# Patient Record
Sex: Female | Born: 1990
Health system: Southern US, Community
[De-identification: ages and names within clinical notes are randomized; demographics above are authoritative.]

## PROBLEM LIST (undated history)

## (undated) ENCOUNTER — Emergency Department (HOSPITAL_BASED_OUTPATIENT_CLINIC_OR_DEPARTMENT_OTHER): Discharge status: Absent without leave | Payer: Medicaid Other

## (undated) DIAGNOSIS — N83209 Unspecified ovarian cyst, unspecified side: Secondary | ICD-10-CM

## (undated) DIAGNOSIS — G43909 Migraine, unspecified, not intractable, without status migrainosus: Secondary | ICD-10-CM

## (undated) DIAGNOSIS — I10 Essential (primary) hypertension: Secondary | ICD-10-CM

## (undated) DIAGNOSIS — N200 Calculus of kidney: Secondary | ICD-10-CM

## (undated) DIAGNOSIS — E119 Type 2 diabetes mellitus without complications: Secondary | ICD-10-CM

## (undated) HISTORY — PX: OTHER SURGICAL HISTORY: SHX169

## (undated) HISTORY — PX: CHOLECYSTECTOMY: SHX55

## (undated) HISTORY — DX: Calculus of kidney: N20.0

## (undated) HISTORY — DX: Essential (primary) hypertension: I10

---

## 2010-12-19 ENCOUNTER — Emergency Department (HOSPITAL_BASED_OUTPATIENT_CLINIC_OR_DEPARTMENT_OTHER)
Admission: EM | Admit: 2010-12-19 | Discharge: 2010-12-19 | Disposition: A | Discharge status: Home / Self Care | Payer: Medicaid Other | Attending: Emergency Medicine | Admitting: Emergency Medicine

## 2010-12-19 ENCOUNTER — Emergency Department (INDEPENDENT_AMBULATORY_CARE_PROVIDER_SITE_OTHER): Payer: Medicaid Other

## 2010-12-19 DIAGNOSIS — M549 Dorsalgia, unspecified: Secondary | ICD-10-CM

## 2010-12-19 DIAGNOSIS — N390 Urinary tract infection, site not specified: Secondary | ICD-10-CM | POA: Insufficient documentation

## 2010-12-19 LAB — URINALYSIS, ROUTINE W REFLEX MICROSCOPIC
Bilirubin Urine: NEGATIVE
Ketones, ur: NEGATIVE mg/dL
Nitrite: NEGATIVE
Protein, ur: NEGATIVE mg/dL
Urobilinogen, UA: 0.2 mg/dL (ref 0.0–1.0)
pH: 5.5 (ref 5.0–8.0)

## 2010-12-19 LAB — URINE MICROSCOPIC-ADD ON

## 2010-12-19 LAB — PREGNANCY, URINE: Preg Test, Ur: NEGATIVE

## 2010-12-20 LAB — URINE CULTURE
Colony Count: 75000
Culture  Setup Time: 201203102115

## 2010-12-24 ENCOUNTER — Emergency Department (INDEPENDENT_AMBULATORY_CARE_PROVIDER_SITE_OTHER): Payer: Medicaid Other

## 2010-12-24 ENCOUNTER — Emergency Department (HOSPITAL_BASED_OUTPATIENT_CLINIC_OR_DEPARTMENT_OTHER)
Admission: EM | Admit: 2010-12-24 | Discharge: 2010-12-24 | Disposition: A | Discharge status: Other Acute Inpatient Hospital | Payer: Medicaid Other | Attending: Emergency Medicine | Admitting: Emergency Medicine

## 2010-12-24 ENCOUNTER — Inpatient Hospital Stay (HOSPITAL_COMMUNITY)
Admission: AD | Admit: 2010-12-24 | Discharge: 2010-12-25 | DRG: 419 | Disposition: A | Discharge status: Home / Self Care | Payer: Medicaid Other | Source: Other Acute Inpatient Hospital | Attending: Surgery | Admitting: Surgery

## 2010-12-24 ENCOUNTER — Inpatient Hospital Stay (HOSPITAL_COMMUNITY): Payer: Medicaid Other

## 2010-12-24 ENCOUNTER — Other Ambulatory Visit: Payer: Self-pay | Admitting: Surgery

## 2010-12-24 DIAGNOSIS — R1013 Epigastric pain: Secondary | ICD-10-CM | POA: Insufficient documentation

## 2010-12-24 DIAGNOSIS — R112 Nausea with vomiting, unspecified: Secondary | ICD-10-CM

## 2010-12-24 DIAGNOSIS — Z79899 Other long term (current) drug therapy: Secondary | ICD-10-CM | POA: Insufficient documentation

## 2010-12-24 DIAGNOSIS — R109 Unspecified abdominal pain: Secondary | ICD-10-CM

## 2010-12-24 DIAGNOSIS — E8881 Metabolic syndrome: Secondary | ICD-10-CM | POA: Diagnosis present

## 2010-12-24 DIAGNOSIS — I10 Essential (primary) hypertension: Secondary | ICD-10-CM | POA: Diagnosis present

## 2010-12-24 DIAGNOSIS — E119 Type 2 diabetes mellitus without complications: Secondary | ICD-10-CM | POA: Insufficient documentation

## 2010-12-24 DIAGNOSIS — E669 Obesity, unspecified: Secondary | ICD-10-CM | POA: Diagnosis present

## 2010-12-24 DIAGNOSIS — R1011 Right upper quadrant pain: Secondary | ICD-10-CM

## 2010-12-24 DIAGNOSIS — K8000 Calculus of gallbladder with acute cholecystitis without obstruction: Principal | ICD-10-CM | POA: Diagnosis present

## 2010-12-24 LAB — COMPREHENSIVE METABOLIC PANEL
ALT: 45 U/L — ABNORMAL HIGH (ref 0–35)
Albumin: 4.4 g/dL (ref 3.5–5.2)
Alkaline Phosphatase: 68 U/L (ref 39–117)
Chloride: 105 mEq/L (ref 96–112)
Glucose, Bld: 125 mg/dL — ABNORMAL HIGH (ref 70–99)
Potassium: 4.2 mEq/L (ref 3.5–5.1)
Sodium: 146 mEq/L — ABNORMAL HIGH (ref 135–145)
Total Bilirubin: 0.5 mg/dL (ref 0.3–1.2)
Total Protein: 8.3 g/dL (ref 6.0–8.3)

## 2010-12-24 LAB — URINALYSIS, ROUTINE W REFLEX MICROSCOPIC
Bilirubin Urine: NEGATIVE
Ketones, ur: 15 mg/dL — AB
Nitrite: POSITIVE — AB
Protein, ur: NEGATIVE mg/dL
Specific Gravity, Urine: 1.027 (ref 1.005–1.030)
Urobilinogen, UA: 1 mg/dL (ref 0.0–1.0)

## 2010-12-24 LAB — PREGNANCY, URINE: Preg Test, Ur: NEGATIVE

## 2010-12-24 LAB — DIFFERENTIAL
Basophils Absolute: 0 10*3/uL (ref 0.0–0.1)
Eosinophils Relative: 2 % (ref 0–5)
Lymphocytes Relative: 21 % (ref 12–46)
Lymphs Abs: 2.8 10*3/uL (ref 0.7–4.0)
Neutrophils Relative %: 70 % (ref 43–77)

## 2010-12-24 LAB — CBC
HCT: 38.6 % (ref 36.0–46.0)
Platelets: 265 10*3/uL (ref 150–400)
RBC: 4.74 MIL/uL (ref 3.87–5.11)
RDW: 14.8 % (ref 11.5–15.5)
WBC: 13.3 10*3/uL — ABNORMAL HIGH (ref 4.0–10.5)

## 2010-12-24 LAB — URINE MICROSCOPIC-ADD ON

## 2010-12-25 LAB — URINE CULTURE: Colony Count: 75000

## 2011-01-04 NOTE — H&P (Signed)
NAMEJUDITHANN, [name]         ACCOUNT NO.:  [id number]  MEDICAL RECORD NO.:  [id number]           PATIENT TYPE:  I  LOCATION:  5158                         FACILITY:  [facility]  PHYSICIAN:  Sandria Bales. Ezzard Standing, M.D.  DATE OF BIRTH:  Jul 26, 1991  DATE OF ADMISSION:  12/24/2010                             HISTORY & PHYSICAL   REFERRING PHYSICIAN:  Dr. Ethelda Chick  CHIEF COMPLAINT:  Abdominal pain, nausea, and vomiting.  BRIEF HISTORY:  The patient is a 20 year old white female who reports waking at around 2 a.m. with nausea, vomiting, and abdominal pain.  She was seen on December 19, 2010, with back pain and underwent an LS spine, which was negative and was treated for a urinary tract infection.  She reports ongoing nausea and vomiting on Sunday.  Monday, she was having ongoing discomfort with nausea and generally felt bad all day.  Tuesday, she was actually better.  No nausea or vomiting, ate well.  On Wednesday, she did well.  On Thursday morning at around 2 a.m., she woke up with most recent bout of nausea and vomiting.    She returned to the ER at [facility].  Acute abdominal films showed no evidence of abdominal disease, normal gas pattern.  Abdominal ultrasound shows small stones are noted.  There is no pericholecystic fluid seen.  Gallbladder was not thickened.  However, there is pain over the gallbladder with compression and it was considered probable for cholecystitis.  Common bile duct is partially obscured, both measures 2 mm on the best possible measurements.  The livers echogenic suggestive of fatty liver.  There is no ductal dilatation.  The IVC was obscured by bowel gas, the pancreas was also obscured.  The spleen was enlarged at 13.7 cm.  The left and right kidney showed no hydronephrosis, the right was 12.9 cm and left was 13.  The abdominal aorta was negative.  It was their impression that the patient had, 1. Multiple gallstones with acute cholecystitis as a possible   diagnosis, no gallbladder wall edema was seen. 2. Fatty infiltration of liver. 3. Splenomegaly.  LABORATORY DATA:  Labs showed an elevated white count of 13.3, hematocrit of 38.6, hemoglobin 12.9, and platelets 265,000.  CMP showed a sodium of 146, potassium of 4.2, chloride of 105, CO2 of 25, BUN of 15, and creatinine is 0.7.  Total bilirubin 0.5, alk phos 68, SGOT elevated at 51, SGPT was elevated at 45, total protein is 8.3, and albumin is 4.4.    UA shows large amount of blood and the urine is cloudy. Leukocytes are positive.  Urine microscopic shows 3-6 white cells per high-power field, red cells were TNTC, there was few bacteria and mucus. Urine pregnancy was negative.  Lipase was 110.    The patient was subsequently referred to Dr. Ezzard Standing and transferred to [facility] for elective cholecystectomy and further treatment is indicated.  PAST MEDICAL HISTORY: 1. High blood pressure since age 20. 2. Borderline diabetes. 3. History of ADD, she had been offered meds 1 year. 4. Height is 68 inches.  Weight is 274 pounds.  BMI is 41.7.  PAST SURGICAL HISTORY:  She had a  surgical fallopian tube removed 14 years ago.  FAMILY HISTORY:  Mother is living with high blood pressure, elevated cholesterol, and diabetes.  Father is unknown.  One brother in good health.  No sisters.  SOCIAL HISTORY:  She denies tobacco, alcohol, or drugs.  She is unemployed and lives with her mother.  REVIEW OF SYSTEMS:   FEVER:  None.   SKIN:  No changes.   CEREBROVASCULAR: No changes.   PULMONARY:  No changes.   CARDIAC:  No cardiac history of chest pain.   GASTROINTESTINAL: Positive for nausea, vomiting, abdominal pain, and back pain since Sunday.  No diarrhea, constipation, or blood in the stool.   GENITOURINARY:  Negative currently.   LOWER EXTREMITIES: No edema or arthritis.   MUSCULOSKELETAL/ARTHRITIS:  None.   ENDOCRINE: None currently.   WEIGHT:  She has lost about 5 pounds over  the last several months secondary to exercise.  PSYCHIATRIC:  No new changes.  CURRENT MEDICATIONS: 1. Atenolol 50 mg daily. 2. Femcon FE birth control one daily. 3. Macrobid and Pyridium.  She is not sure whether just one of those     left.  ALLERGIES:  None.  PHYSICAL EXAMINATION:  GENERAL:  This is a obese white female in no acute distress, very anxious, and in pain. VITAL SIGNS:  Temperature in [place] was afebrile, 98.4 and 98.6, blood pressures range from 145/94 on admission then up to 163/93 to a low of 132/82, heart rates has been screened 70 and 86, and sats are 99- 100% on room air.  She has had ongoing pain. HEENT:  Head:  Normocephalic.  Eyes:  PERRLA.  Ears:  Hearing is normal. Nose, throat, and mouth:  Grossly normal.  She has a stud in her nose. NECK:  Trachea is in the midline.  Thyroid was not palpable. CHEST:  Clear to auscultation.  Chest wall is nontender. RESPIRATORY:  Effort is normal. CARDIAC:  Normal S1, S2.  No murmur was heard.  Pulses are +2 and equal in both upper and lower extremities. ABDOMEN:  Bowel sounds are present.  Abdomen is nondistended.  She is extremely tender over the right upper quadrant.  No hernias, masses, or abscesses were noted. GENITOURINARY/RECTAL:  Deferred. LYMPHADENOPATHY:  None palpated. MUSCULOSKELETAL:  Normal strength and joints. SKIN:  No changes. NEUROLOGIC:  Cranial nerves grossly within normal limits.  No focal changes. PSYCHIATRIC:  She is very anxious and crying.  LABORATORY AND DIAGNOSTICS:  As above.  IMPRESSION: 1. Cholelithiasis, abdominal pain, nausea, vomiting; probably acute     cholecystitis. 2. Hypertension. 3. Borderline insulin-resistant diabetes mellitus. 4. Attention deficit disorder off medications 1 year. 5. BMI of 41.5.  PLAN:  The patient has been ready for the OR now.  We will start her on some antibiotics, Phenergan, and morphine for pain.  Further workup and evaluation as  needed.   Eber Hong, P.A.   Sandria Bales. Ezzard Standing, M.D., FACS   WDJ/MEDQ  D:  12/24/2010  T:  12/24/2010  Job:  [id number]  cc:   Charlesetta Garibaldi, MD Earlene Plater, M.D.  Electronically Signed by Sherrie George P.A. on 12/30/2010 03:32:05 PM Electronically Signed by Ovidio Kin M.D. on 01/04/2011 10:38:34 AM

## 2011-01-04 NOTE — Op Note (Signed)
[name], [name]         ACCOUNT NO.:  [id number]  MEDICAL RECORD NO.:  [id number]           PATIENT TYPE:  I  LOCATION:  5158                         FACILITY:  [facility]  PHYSICIAN:  Sandria Bales. Ezzard Standing, M.D.  DATE OF BIRTH:  [birth year]  DATE OF PROCEDURE: 24 December 2010                              OPERATIVE REPORT   PREOPERATIVE DIAGNOSIS:  Cholecystitis with cholelithiasis.  POSTOPERATIVE DIAGNOSIS:  Acute edematous cholecystitis with white bile, cholelithiasis.  PROCEDURE:  Laparoscopic cholecystectomy (five port).  SURGEON:  Sandria Bales. Ezzard Standing, M.D.  FIRST ASSISTANT:  Juanetta Gosling, MD.  ANESTHESIA:  General endotracheal.  ESTIMATED BLOOD LOSS:  Minimal.  PROCEDURE:  Ms. [name] is a 20 year old white female who sees Dr. Earlene Plater at [facility], who is her primary care doctor.  She had a urinary tract infection for she was treated at the [facility] emergency room near [facility] about a week ago.  She then re- presented today with acute abdominal pain and ultrasound showed gallstones.  She had normal liver functions, mildly elevated white blood count of 13,300, and she was sent over from the [facility] ER to [facility].  She has the signs, symptoms, and renalogic exam consistent with acute cholecystitis.  I talked to the patient and her parents about proceeding with cholecystectomy, talked about both the indications and risks of gallbladder surgery.  The risks of gallbladder surgery include but are not limited to, bleeding, infection, common bile duct injury, the possibility of open surgery, and the possibility of another cause for her acute abdominal pain.  She is only 20 years old.  She is morbidly obese with a BMI of approximately 41, has already been treated for hypertension.  OPERATIVE NOTE:  She was placed in a supine position in room 17.  Dr. Sharee Holster I think is her attending anesthesiologist.  She underwent a general  endotracheal anesthetic.  Her abdomen was prepped with ChloraPrep, sterilely draped.  Received 1 g of Ancef at commencement of this procedure.  A time-out was held and surgical checklist run.  I made an infraumbilical incision with sharp dissection, carried down to the abdominal cavity.  A 0-degree 10-mm laparoscope was inserted through a 12-mm Hasson trocar and the Hasson trocar secured with a 0 Vicryl suture.  She had adhesions in her lower abdomen from a prior removal of left fallopian cyst several years ago, but I was able to get the scope into the abdominal cavity without any difficulty.  I placed four additional trocars, a 10-mm subxiphoid trocar, a 5-mm right mid subcostal trocar, a 5-mm right lateral subcostal trocar, and the 15-mm trocar sort of an extra hand midway between the subxiphoid area and the umbilicus.    The right and left lobe of liver showed some fatty infiltration, but otherwise unremarkable.  The stomach that I could see was unremarkable.  The bowel that I could see was unremarkable.  She had an acutely edematous gallbladder consistent with probable cystic duct obstruction.  I aspirated the gallbladder of about 80 mL of white bile, then rotated the gallbladder up.  She had some adhesions around the neck of the gallbladder.  I  was able to dissect it off free.  I got down to identify a cystic artery which I triply endo clipped and divided a cystic duct which was about 3 cm long.  I placed a clip on the gallbladder side of the cystic duct and shot an intraoperative cholangiogram.  The intraoperative cholangiogram was shot using a cutoff taut catheter inserted through the 14-gauge Angiocath into the side of the cut cystic duct.  The intraoperative cholangiogram showed free flow of contrast down actually a fairly long cystic duct, maybe 3 cm in length.  It flowed freely into the duodenum, down the common bile duct and up the hepatic radicals.  There was no filling  defect, no mass, and this was felt to be a normal intraoperative cholangiogram.  The taut catheter was then removed, the cystic duct triple endo clipped and divided, and the gallbladder then pulled sharply and bluntly dissected from the gallbladder bed.  The gallbladder was partially intrahepatic, and also due to her size and weight of her liver that made the dissection somewhat difficult, but I was able to get the gallbladder out and placed it in Endocatch bag.  I then reinspected the gallbladder wall.  I reinspected the triangle of Calot.  I saw no filling defect, and I saw no bleeding and no bile leak.  I placed the gallbladder in an EndoCatch bag, delivered it through the umbilicus.  I then irrigated her abdominal cavity with about a liter and half of saline.    I then closed the umbilical port with two 0 Vicryl sutures.  Subcutaneous tissue was infiltrated with about 20 mL of 0.25% Marcaine.  The skin was closed with a running 5-0 Vicryl suture painted with Dermabond and sterilely dressed.  The patient tolerated the procedure well, was transported to recovery in good condition.  Sponge and needle counts were correct at the end of the case.   Sandria Bales. Ezzard Standing, M.D., FACS   DHN/MEDQ  D:  12/24/2010  T:  12/25/2010  Job:  [id number]  cc:   Earlene Plater, M.D.  Electronically Signed by Ovidio Kin M.D. on 01/04/2011 10:42:38 AM

## 2011-02-24 ENCOUNTER — Encounter (INDEPENDENT_AMBULATORY_CARE_PROVIDER_SITE_OTHER): Payer: Self-pay | Admitting: Surgery

## 2014-10-22 ENCOUNTER — Encounter (HOSPITAL_BASED_OUTPATIENT_CLINIC_OR_DEPARTMENT_OTHER): Payer: Self-pay | Admitting: *Deleted

## 2014-10-22 ENCOUNTER — Emergency Department (HOSPITAL_BASED_OUTPATIENT_CLINIC_OR_DEPARTMENT_OTHER)
Admission: EM | Admit: 2014-10-22 | Discharge: 2014-10-22 | Disposition: A | Discharge status: Home / Self Care | Payer: Medicaid Other | Attending: Emergency Medicine | Admitting: Emergency Medicine

## 2014-10-22 DIAGNOSIS — I10 Essential (primary) hypertension: Secondary | ICD-10-CM | POA: Insufficient documentation

## 2014-10-22 DIAGNOSIS — Z7982 Long term (current) use of aspirin: Secondary | ICD-10-CM | POA: Insufficient documentation

## 2014-10-22 DIAGNOSIS — G44209 Tension-type headache, unspecified, not intractable: Secondary | ICD-10-CM | POA: Insufficient documentation

## 2014-10-22 DIAGNOSIS — Z720 Tobacco use: Secondary | ICD-10-CM | POA: Insufficient documentation

## 2014-10-22 MED ORDER — METOCLOPRAMIDE HCL 5 MG/ML IJ SOLN
10.0000 mg | Freq: Once | INTRAMUSCULAR | Status: AC
  Administered 2014-10-22: 10 mg via INTRAMUSCULAR
  Filled 2014-10-22: qty 2

## 2014-10-22 MED ORDER — IBUPROFEN 800 MG PO TABS: 800.0000 mg | ORAL_TABLET | Freq: Three times a day (TID) | ORAL | Status: DC

## 2014-10-22 MED ORDER — KETOROLAC TROMETHAMINE 60 MG/2ML IM SOLN
60.0000 mg | Freq: Once | INTRAMUSCULAR | Status: AC
  Administered 2014-10-22: 60 mg via INTRAMUSCULAR
  Filled 2014-10-22: qty 2

## 2014-10-22 MED ORDER — DIPHENHYDRAMINE HCL 25 MG PO CAPS
25.0000 mg | ORAL_CAPSULE | Freq: Once | ORAL | Status: AC
  Administered 2014-10-22: 25 mg via ORAL
  Filled 2014-10-22: qty 1

## 2014-10-22 NOTE — ED Provider Notes (Signed)
CSN: [id number]     Arrival date & time 10/22/14  1919 History   First MD Initiated Contact with Patient 10/22/14 2051     Chief Complaint  Patient presents with  . Headache     (Consider location/radiation/quality/duration/timing/severity/associated sxs/prior Treatment) HPI Comments: She complains of a headache for the past 24 hours that is no better with Exedrin Migraine at home. No nausea or vomiting. The headache was gradual in onset and affects bilateral frontal to occipital areas. No fever, congestion or sinus pressure, sore throat, or history of same.   Patient is a 24 y.o. female presenting with headaches. The history is provided by the patient. No language interpreter was used.  Headache Associated symptoms: no fever     Past Medical History  Diagnosis Date  . Hypertension    Past Surgical History  Procedure Laterality Date  . Cholecystectomy    . Tubes removed     Family History  Problem Relation Age of Onset  . Hypertension Mother    History  Substance Use Topics  . Smoking status: Current Some Day Smoker -- 0.50 packs/day  . Smokeless tobacco: Not on file  . Alcohol Use: No   OB History    No data available     Review of Systems  Constitutional: Negative for fever and chills.  Respiratory: Negative.   Cardiovascular: Negative.   Gastrointestinal: Negative.   Musculoskeletal: Negative.   Skin: Negative.   Neurological: Positive for headaches.      Allergies  Review of patient's allergies indicates not on file.  Home Medications   Prior to Admission medications   Medication Sig Start Date End Date Taking? Authorizing Provider  aspirin-acetaminophen-caffeine (EXCEDRIN MIGRAINE) 9154801581250-250-65 MG per tablet Take by mouth every 6 (six) hours as needed for headache.   Yes Historical Provider, MD  atenolol (TENORMIN) 50 MG tablet Take 50 mg by mouth daily.      Historical Provider, MD   BP 159/93 mmHg  Temp(Src) 98.2 F (36.8 C)  Resp 16  Ht 5\' 6"   (1.676 m)  Wt 268 lb (121.564 kg)  BMI 43.28 kg/m2  SpO2 99% Physical Exam  Constitutional: She is oriented to person, place, and time. She appears well-developed and well-nourished.  HENT:  Head: Normocephalic.  Neck: Normal range of motion. Neck supple.  Cardiovascular: Normal rate and regular rhythm.   Pulmonary/Chest: Effort normal and breath sounds normal.  Abdominal: Soft. Bowel sounds are normal. There is no tenderness. There is no rebound and no guarding.  Musculoskeletal: Normal range of motion.  Bilateral paracervical tenderness that reproduces headache.  Neurological: She is alert and oriented to person, place, and time.  Skin: Skin is warm and dry. No rash noted.  Psychiatric: She has a normal mood and affect.    ED Course  Procedures (including critical care time) Labs Review Labs Reviewed - No data to display  Imaging Review No results found.   EKG Interpretation None      MDM   Final diagnoses:  None    1. Tension type headache  Improved with medications. She is sitting up in bed on re-evaluation, NAD, discussing that she is hungry. Stable for discharge.     Arnoldo HookerShari A Lakechia Nay, PA-C 10/24/14 0541  Mirian MoMatthew Gentry, MD 10/25/14 [phone]

## 2014-10-22 NOTE — ED Notes (Signed)
Pt c/o h/a x 1 day 

## 2014-10-22 NOTE — Discharge Instructions (Signed)
Tension Headache °A tension headache is pain, pressure, or aching felt over the front and sides of the head. Tension headaches often come after stress, feeling worried (anxiety), or feeling sad or down for a while (depressed). °HOME CARE °· Only take medicine as told by your doctor. °· Lie down in a dark, quiet room when you have a headache. °· Keep a journal to find out if certain things bring on headaches. For example, write down: °¨ What you eat and drink. °¨ How much sleep you get. °¨ Any change to your diet or medicines. °· Relax by getting a massage or doing other relaxing activities. °· Put ice or heat packs on the head and neck area as told by your doctor. °· Lessen stress. °· Sit up straight. Do not tighten (tense) your muscles. °· Quit smoking if you smoke. °· Lessen how much alcohol you drink. °· Lessen how much caffeine you drink, or stop drinking caffeine. °· Eat and exercise regularly. °· Get enough sleep. °· Avoid using too much pain medicine. °GET HELP RIGHT AWAY IF:  °· Your headache becomes really bad. °· You have a fever. °· You have a stiff neck. °· You have trouble seeing. °· Your muscles are weak, or you lose muscle control. °· You lose your balance or have trouble walking. °· You feel like you will pass out (faint), or you pass out. °· You have really bad symptoms that are different than your first symptoms. °· You have problems with the medicines given to you by your doctor. °· Your medicines do not work. °· Your headache feels different than the other headaches. °· You feel sick to your stomach (nauseous) or throw up (vomit). °MAKE SURE YOU:  °· Understand these instructions. °· Will watch your condition. °· Will get help right away if you are not doing well or get worse. °Document Released: 12/22/2009 Document Revised: 12/20/2011 Document Reviewed: 09/17/2011 °ExitCare® Patient Information ©2015 [employer]. This information is not intended to replace advice given to you by your health  care provider. Make sure you discuss any questions you have with your health care provider. ° °

## 2014-10-24 ENCOUNTER — Emergency Department (HOSPITAL_BASED_OUTPATIENT_CLINIC_OR_DEPARTMENT_OTHER)
Admission: EM | Admit: 2014-10-24 | Discharge: 2014-10-24 | Disposition: A | Discharge status: Home / Self Care | Payer: Medicaid Other | Attending: Emergency Medicine | Admitting: Emergency Medicine

## 2014-10-24 ENCOUNTER — Encounter (HOSPITAL_BASED_OUTPATIENT_CLINIC_OR_DEPARTMENT_OTHER): Payer: Self-pay | Admitting: *Deleted

## 2014-10-24 DIAGNOSIS — I10 Essential (primary) hypertension: Secondary | ICD-10-CM | POA: Insufficient documentation

## 2014-10-24 DIAGNOSIS — Z3202 Encounter for pregnancy test, result negative: Secondary | ICD-10-CM | POA: Insufficient documentation

## 2014-10-24 DIAGNOSIS — Z79899 Other long term (current) drug therapy: Secondary | ICD-10-CM | POA: Insufficient documentation

## 2014-10-24 DIAGNOSIS — R51 Headache: Secondary | ICD-10-CM | POA: Insufficient documentation

## 2014-10-24 DIAGNOSIS — R8271 Bacteriuria: Secondary | ICD-10-CM

## 2014-10-24 DIAGNOSIS — Z720 Tobacco use: Secondary | ICD-10-CM | POA: Insufficient documentation

## 2014-10-24 DIAGNOSIS — R739 Hyperglycemia, unspecified: Secondary | ICD-10-CM

## 2014-10-24 DIAGNOSIS — N390 Urinary tract infection, site not specified: Secondary | ICD-10-CM | POA: Insufficient documentation

## 2014-10-24 LAB — CBC WITH DIFFERENTIAL/PLATELET
BASOS ABS: 0 10*3/uL (ref 0.0–0.1)
BASOS PCT: 0 % (ref 0–1)
EOS ABS: 0.1 10*3/uL (ref 0.0–0.7)
Eosinophils Relative: 1 % (ref 0–5)
HEMATOCRIT: 41.2 % (ref 36.0–46.0)
HEMOGLOBIN: 14.4 g/dL (ref 12.0–15.0)
LYMPHS ABS: 3.2 10*3/uL (ref 0.7–4.0)
Lymphocytes Relative: 35 % (ref 12–46)
MCH: 29.1 pg (ref 26.0–34.0)
MCHC: 35 g/dL (ref 30.0–36.0)
MCV: 83.4 fL (ref 78.0–100.0)
MONO ABS: 0.6 10*3/uL (ref 0.1–1.0)
Monocytes Relative: 6 % (ref 3–12)
NEUTROS ABS: 5.3 10*3/uL (ref 1.7–7.7)
NEUTROS PCT: 58 % (ref 43–77)
PLATELETS: 241 10*3/uL (ref 150–400)
RBC: 4.94 MIL/uL (ref 3.87–5.11)
RDW: 12.5 % (ref 11.5–15.5)
WBC: 9.1 10*3/uL (ref 4.0–10.5)

## 2014-10-24 LAB — BASIC METABOLIC PANEL
Anion gap: 11 (ref 5–15)
BUN: 10 mg/dL (ref 6–23)
CHLORIDE: 100 meq/L (ref 96–112)
CO2: 25 mmol/L (ref 19–32)
CREATININE: 0.72 mg/dL (ref 0.50–1.10)
Calcium: 9.6 mg/dL (ref 8.4–10.5)
GFR calc non Af Amer: >90 mL/min (ref >90)
Glucose, Bld: 263 mg/dL — ABNORMAL HIGH (ref 70–99)
POTASSIUM: 3.9 mmol/L (ref 3.5–5.1)
SODIUM: 136 mmol/L (ref 135–145)

## 2014-10-24 LAB — URINALYSIS, ROUTINE W REFLEX MICROSCOPIC
GLUCOSE, UA: 500 mg/dL — AB
Ketones, ur: 15 mg/dL — AB
Nitrite: NEGATIVE
Protein, ur: >300 mg/dL — AB
SPECIFIC GRAVITY, URINE: 1.034 — AB (ref 1.005–1.030)
UROBILINOGEN UA: 0.2 mg/dL (ref 0.0–1.0)
pH: 5 (ref 5.0–8.0)

## 2014-10-24 LAB — URINE MICROSCOPIC-ADD ON

## 2014-10-24 LAB — PREGNANCY, URINE: PREG TEST UR: NEGATIVE

## 2014-10-24 MED ORDER — CEPHALEXIN 500 MG PO CAPS: 500.0000 mg | ORAL_CAPSULE | Freq: Four times a day (QID) | ORAL | Status: DC

## 2014-10-24 MED ORDER — DEXTROSE 5 % IV SOLN
1.0000 g | Freq: Once | INTRAVENOUS | Status: AC
  Administered 2014-10-24: 1 g via INTRAVENOUS

## 2014-10-24 MED ORDER — ONDANSETRON HCL 4 MG/2ML IJ SOLN
INTRAMUSCULAR | Status: AC
Start: 2014-10-24 — End: 2014-10-24
  Administered 2014-10-24: 4 mg
  Filled 2014-10-24: qty 2

## 2014-10-24 MED ORDER — CEFTRIAXONE SODIUM 1 G IJ SOLR
INTRAMUSCULAR | Status: AC
  Filled 2014-10-24: qty 10

## 2014-10-24 MED ORDER — KETOROLAC TROMETHAMINE 30 MG/ML IJ SOLN
30.0000 mg | Freq: Once | INTRAMUSCULAR | Status: AC
  Administered 2014-10-24: 30 mg via INTRAVENOUS
  Filled 2014-10-24: qty 1

## 2014-10-24 MED ORDER — ONDANSETRON 4 MG PO TBDP: 4.0000 mg | ORAL_TABLET | Freq: Three times a day (TID) | ORAL | Status: DC | PRN

## 2014-10-24 NOTE — Discharge Instructions (Signed)
Hyperglycemia °Hyperglycemia occurs when the glucose (sugar) in your blood is too high. Hyperglycemia can happen for many reasons, but it most often happens to people who do not know they have diabetes or are not managing their diabetes properly.  °CAUSES  °Whether you have diabetes or not, there are other causes of hyperglycemia. Hyperglycemia can occur when you have diabetes, but it can also occur in other situations that you might not be as aware of, such as: °Diabetes °· If you have diabetes and are having problems controlling your blood glucose, hyperglycemia could occur because of some of the following reasons: °¨ Not following your meal plan. °¨ Not taking your diabetes medications or not taking it properly. °¨ Exercising less or doing less activity than you normally do. °¨ Being sick. °Pre-diabetes °· This cannot be ignored. Before people develop Type 2 diabetes, they almost always have "pre-diabetes." This is when your blood glucose levels are higher than normal, but not yet high enough to be diagnosed as diabetes. Research has shown that some long-term damage to the body, especially the heart and circulatory system, may already be occurring during pre-diabetes. If you take action to manage your blood glucose when you have pre-diabetes, you may delay or prevent Type 2 diabetes from developing. °Stress °· If you have diabetes, you may be "diet" controlled or on oral medications or insulin to control your diabetes. However, you may find that your blood glucose is higher than usual in the hospital whether you have diabetes or not. This is often referred to as "stress hyperglycemia." Stress can elevate your blood glucose. This happens because of hormones put out by the body during times of stress. If stress has been the cause of your high blood glucose, it can be followed regularly by your caregiver. That way he/she can make sure your hyperglycemia does not continue to get worse or progress to  diabetes. °Steroids °· Steroids are medications that act on the infection fighting system (immune system) to block inflammation or infection. One side effect can be a rise in blood glucose. Most people can produce enough extra insulin to allow for this rise, but for those who cannot, steroids make blood glucose levels go even higher. It is not unusual for steroid treatments to "uncover" diabetes that is developing. It is not always possible to determine if the hyperglycemia will go away after the steroids are stopped. A special blood test called an A1c is sometimes done to determine if your blood glucose was elevated before the steroids were started. °SYMPTOMS °· Thirsty. °· Frequent urination. °· Dry mouth. °· Blurred vision. °· Tired or fatigue. °· Weakness. °· Sleepy. °· Tingling in feet or leg. °DIAGNOSIS  °Diagnosis is made by monitoring blood glucose in one or all of the following ways: °· A1c test. This is a chemical found in your blood. °· Fingerstick blood glucose monitoring. °· Laboratory results. °TREATMENT  °First, knowing the cause of the hyperglycemia is important before the hyperglycemia can be treated. Treatment may include, but is not be limited to: °· Education. °· Change or adjustment in medications. °· Change or adjustment in meal plan. °· Treatment for an illness, infection, etc. °· More frequent blood glucose monitoring. °· Change in exercise plan. °· Decreasing or stopping steroids. °· Lifestyle changes. °HOME CARE INSTRUCTIONS  °· Test your blood glucose as directed. °· Exercise regularly. Your caregiver will give you instructions about exercise. Pre-diabetes or diabetes which comes on with stress is helped by exercising. °· Eat wholesome,   balanced meals. Eat often and at regular, fixed times. Your caregiver or nutritionist will give you a meal plan to guide your sugar intake.  Being at an ideal weight is important. If needed, losing as little as 10 to 15 pounds may help improve blood  glucose levels. SEEK MEDICAL CARE IF:   You have questions about medicine, activity, or diet.  You continue to have symptoms (problems such as increased thirst, urination, or weight gain). SEEK IMMEDIATE MEDICAL CARE IF:   You are vomiting or have diarrhea.  Your breath smells fruity.  You are breathing faster or slower.  You are very sleepy or incoherent.  You have numbness, tingling, or pain in your feet or hands.  You have chest pain.  Your symptoms get worse even though you have been following your caregiver's orders.  If you have any other questions or concerns. Document Released: 03/23/2001 Document Revised: 12/20/2011 Document Reviewed: 01/24/2012 [facility] Patient Information 2015 [place]ExitCare, [place]LLC. This information is not intended to replace advice given to you by your health care provider. Make sure you discuss any questions you have with your health care provider. Urinary Tract Infection A urinary tract infection (UTI) can occur any place along the urinary tract. The tract includes the kidneys, ureters, bladder, and urethra. A type of germ called bacteria often causes a UTI. UTIs are often helped with antibiotic medicine.  HOME CARE   If given, take antibiotics as told by your doctor. Finish them even if you start to feel better.  Drink enough fluids to keep your pee (urine) clear or pale yellow.  Avoid tea, drinks with caffeine, and bubbly (carbonated) drinks.  Pee often. Avoid holding your pee in for a long time.  Pee before and after having sex (intercourse).  Wipe from front to back after you poop (bowel movement) if you are a woman. Use each tissue only once. GET HELP RIGHT AWAY IF:   You have back pain.  You have lower belly (abdominal) pain.  You have chills.  You feel sick to your stomach (nauseous).  You throw up (vomit).  Your burning or discomfort with peeing does not go away.  You have a fever.  Your symptoms are not better in 3 days. MAKE  SURE YOU:   Understand these instructions.  Will watch your condition.  Will get help right away if you are not doing well or get worse. Document Released: 03/15/2008 Document Revised: 06/21/2012 Document Reviewed: 04/27/2012 [facility]Exit[facility] Patient Information 2015 [place]Care, [place]LL[place]. This information is not intended to replace advice given to you by your health care provider. Make sure you discuss any questions you have with your health care provider.

## 2014-10-24 NOTE — ED Notes (Signed)
Patient states she was seen here two days ago for a headache.  States she continues to have the same headache, with some improvement in the pain today.  States her appetite has decreased appetite and po intake.  Today at lunch she ate some lasagne and shortly afterwards began to vomit.  Vomited x 4 today. Continues to have nausea.

## 2014-10-24 NOTE — ED Provider Notes (Signed)
CSN: [id number]     Arrival date & time 10/24/14  1738 History   First MD Initiated Contact with Patient 10/24/14 1749     Chief Complaint  Patient presents with  . Emesis     (Consider location/radiation/quality/duration/timing/severity/associated sxs/prior Treatment) HPI Comments: Patient seen on 10/22/14 for evaluation of headache.  Treated with migraine cocktail and released with rx for NSAID.  Since returning home, headache has persisted with onset of vomiting today.  No photophobia.  Neuro exam grossly normal.  No nuchal rigidity but does report neck pain.  No fever.  Patient is a 24 y.o. female presenting with vomiting.  Emesis Severity:  Moderate Duration:  1 day Timing:  Intermittent Quality:  Undigested food Progression:  Worsening Chronicity:  New Associated symptoms: headaches   Associated symptoms: no abdominal pain, no diarrhea and no fever   Headaches:    Severity:  Severe   Onset quality:  Sudden   Duration:  3 days   Timing:  Constant   Progression:  Waxing and waning   Chronicity:  New   Past Medical History  Diagnosis Date  . Hypertension    Past Surgical History  Procedure Laterality Date  . Cholecystectomy    . Tubes removed     Family History  Problem Relation Age of Onset  . Hypertension Mother    History  Substance Use Topics  . Smoking status: Current Some Day Smoker -- 0.50 packs/day  . Smokeless tobacco: Not on file  . Alcohol Use: No   OB History    No data available     Review of Systems  Gastrointestinal: Positive for vomiting. Negative for abdominal pain and diarrhea.  Neurological: Positive for headaches. Negative for speech difficulty and weakness.  All other systems reviewed and are negative.     Allergies  Review of patient's allergies indicates not on file.  Home Medications   Prior to Admission medications   Medication Sig Start Date End Date Taking? Authorizing Provider  aspirin-acetaminophen-caffeine (EXCEDRIN  MIGRAINE) [phone] MG per tablet Take by mouth every 6 (six) hours as needed for headache.    Historical Provider, MD  atenolol (TENORMIN) 50 MG tablet Take 50 mg by mouth daily.      Historical Provider, MD  ibuprofen (ADVIL,MOTRIN) 800 MG tablet Take 1 tablet (800 mg total) by mouth 3 (three) times daily. 10/22/14   Shari A Upstill, PA-C   There were no vitals taken for this visit. Physical Exam  Constitutional: She is oriented to person, place, and time. She appears well-developed and well-nourished.  HENT:  Head: Normocephalic.  Eyes: EOM are normal. Pupils are equal, round, and reactive to light.  Neck: Normal range of motion. Neck supple.  Cardiovascular: Normal rate and regular rhythm.   Pulmonary/Chest: Effort normal and breath sounds normal.  Musculoskeletal: She exhibits no edema or tenderness.  Lymphadenopathy:    She has no cervical adenopathy.  Neurological: She is alert and oriented to person, place, and time. No cranial nerve deficit.  Skin: Skin is warm and dry. No rash noted.  Psychiatric: She has a normal mood and affect.  Nursing note and vitals reviewed.   ED Course  Procedures (including critical care time) Labs Review Labs Reviewed - No data to display  Imaging Review No results found.   EKG Interpretation None     Headache improved after medication.  UA concerning for UTI.  Elevated blood glucose, along with ketones and elevated urine glucose concerning for diabetes.  Family history  of diabetes in mother and father.  Denies polydipsia, polyuria, polyphagia.  Patient has an appointment scheduled with her PCP on 11/01/14.  Patient will randomly check her blood sugar at home, record results, and share with her PCP. MDM   Final diagnoses:  None    UTI. Hyperglycemia.    Jimmye Normanavid John Olawale Marney, NP 10/24/14 [id number]  Hilario Quarryanielle S Ray, MD 10/28/14 [id number]

## 2015-04-06 ENCOUNTER — Encounter: Payer: Self-pay | Admitting: Internal Medicine

## 2015-04-06 ENCOUNTER — Observation Stay
Admission: EM | Admit: 2015-04-06 | Discharge: 2015-04-07 | Disposition: A | Discharge status: Home / Self Care | Payer: Self-pay | Attending: Internal Medicine | Admitting: Internal Medicine

## 2015-04-06 ENCOUNTER — Emergency Department: Payer: Self-pay

## 2015-04-06 DIAGNOSIS — R51 Headache: Secondary | ICD-10-CM | POA: Insufficient documentation

## 2015-04-06 DIAGNOSIS — R1111 Vomiting without nausea: Secondary | ICD-10-CM

## 2015-04-06 DIAGNOSIS — G43909 Migraine, unspecified, not intractable, without status migrainosus: Secondary | ICD-10-CM | POA: Diagnosis present

## 2015-04-06 DIAGNOSIS — E1165 Type 2 diabetes mellitus with hyperglycemia: Secondary | ICD-10-CM | POA: Insufficient documentation

## 2015-04-06 DIAGNOSIS — R739 Hyperglycemia, unspecified: Secondary | ICD-10-CM | POA: Diagnosis present

## 2015-04-06 DIAGNOSIS — R509 Fever, unspecified: Secondary | ICD-10-CM | POA: Insufficient documentation

## 2015-04-06 DIAGNOSIS — I10 Essential (primary) hypertension: Secondary | ICD-10-CM | POA: Insufficient documentation

## 2015-04-06 DIAGNOSIS — R000 Tachycardia, unspecified: Secondary | ICD-10-CM | POA: Insufficient documentation

## 2015-04-06 DIAGNOSIS — F17210 Nicotine dependence, cigarettes, uncomplicated: Secondary | ICD-10-CM | POA: Insufficient documentation

## 2015-04-06 DIAGNOSIS — N390 Urinary tract infection, site not specified: Secondary | ICD-10-CM | POA: Insufficient documentation

## 2015-04-06 DIAGNOSIS — R112 Nausea with vomiting, unspecified: Secondary | ICD-10-CM | POA: Insufficient documentation

## 2015-04-06 DIAGNOSIS — R55 Syncope and collapse: Secondary | ICD-10-CM | POA: Insufficient documentation

## 2015-04-06 DIAGNOSIS — Z6838 Body mass index (BMI) 38.0-38.9, adult: Secondary | ICD-10-CM | POA: Insufficient documentation

## 2015-04-06 DIAGNOSIS — R6510 Systemic inflammatory response syndrome (SIRS) of non-infectious origin without acute organ dysfunction: Principal | ICD-10-CM | POA: Insufficient documentation

## 2015-04-06 HISTORY — DX: Migraine, unspecified, not intractable, without status migrainosus: G43.909

## 2015-04-06 HISTORY — DX: Morbid (severe) obesity due to excess calories: E66.01

## 2015-04-06 LAB — COMPREHENSIVE METABOLIC PANEL
ALK PHOS: 60 U/L (ref 38–126)
ALT: 49 U/L (ref 14–54)
ANION GAP: 11 (ref 5–15)
AST: 43 U/L — ABNORMAL HIGH (ref 15–41)
Albumin: 4.1 g/dL (ref 3.5–5.0)
BILIRUBIN TOTAL: 0.8 mg/dL (ref 0.3–1.2)
BUN: 7 mg/dL (ref 6–20)
CHLORIDE: 100 mmol/L — AB (ref 101–111)
CO2: 22 mmol/L (ref 22–32)
CREATININE: 0.74 mg/dL (ref 0.44–1.00)
Calcium: 8.9 mg/dL (ref 8.9–10.3)
GFR calc Af Amer: >60 mL/min (ref >60)
Glucose, Bld: 268 mg/dL — ABNORMAL HIGH (ref 65–99)
Potassium: 3.7 mmol/L (ref 3.5–5.1)
Sodium: 133 mmol/L — ABNORMAL LOW (ref 135–145)
Total Protein: 8.1 g/dL (ref 6.5–8.1)

## 2015-04-06 LAB — URINE DRUG SCREEN, QUALITATIVE (ARMC ONLY)
AMPHETAMINES, UR SCREEN: NOT DETECTED
BENZODIAZEPINE, UR SCRN: NOT DETECTED
Barbiturates, Ur Screen: NOT DETECTED
CANNABINOID 50 NG, UR ~~LOC~~: NOT DETECTED
Cocaine Metabolite,Ur ~~LOC~~: NOT DETECTED
MDMA (Ecstasy)Ur Screen: NOT DETECTED
Methadone Scn, Ur: NOT DETECTED
Opiate, Ur Screen: NOT DETECTED
Phencyclidine (PCP) Ur S: NOT DETECTED
Tricyclic, Ur Screen: NOT DETECTED

## 2015-04-06 LAB — URINALYSIS COMPLETE WITH MICROSCOPIC (ARMC ONLY)
Bilirubin Urine: NEGATIVE
Glucose, UA: >500 mg/dL — AB
Ketones, ur: NEGATIVE mg/dL
Nitrite: NEGATIVE
Protein, ur: 30 mg/dL — AB
Specific Gravity, Urine: 1.027 (ref 1.005–1.030)
Squamous Epithelial / HPF: NONE SEEN
WBC, UA: NONE SEEN WBC/hpf (ref 0–5)
pH: 5 (ref 5.0–8.0)

## 2015-04-06 LAB — CBC WITH DIFFERENTIAL/PLATELET
Basophils Absolute: 0.1 10*3/uL (ref 0–0.1)
Basophils Relative: 1 %
Eosinophils Absolute: 0.1 10*3/uL (ref 0–0.7)
Eosinophils Relative: 1 %
HCT: 40.4 % (ref 35.0–47.0)
Hemoglobin: 13.7 g/dL (ref 12.0–16.0)
Lymphocytes Relative: 13 %
Lymphs Abs: 1.8 10*3/uL (ref 1.0–3.6)
MCH: 29.1 pg (ref 26.0–34.0)
MCHC: 33.8 g/dL (ref 32.0–36.0)
MCV: 86.1 fL (ref 80.0–100.0)
Monocytes Absolute: 0.8 10*3/uL (ref 0.2–0.9)
Monocytes Relative: 6 %
Neutro Abs: 11.1 10*3/uL — ABNORMAL HIGH (ref 1.4–6.5)
Neutrophils Relative %: 79 %
Platelets: 183 10*3/uL (ref 150–440)
RBC: 4.69 MIL/uL (ref 3.80–5.20)
RDW: 13.5 % (ref 11.5–14.5)
WBC: 13.9 10*3/uL — ABNORMAL HIGH (ref 3.6–11.0)

## 2015-04-06 LAB — ETHANOL: Alcohol, Ethyl (B): <5 mg/dL (ref <5)

## 2015-04-06 LAB — SALICYLATE LEVEL: Salicylate Lvl: <4 mg/dL (ref 2.8–30.0)

## 2015-04-06 LAB — LACTIC ACID, PLASMA: Lactic Acid, Venous: 2.9 mmol/L (ref 0.5–2.0)

## 2015-04-06 LAB — ACETAMINOPHEN LEVEL: Acetaminophen (Tylenol), Serum: <10 ug/mL — ABNORMAL LOW (ref 10–30)

## 2015-04-06 LAB — HCG, QUANTITATIVE, PREGNANCY: hCG, Beta Chain, Quant, S: <1 m[IU]/mL (ref <5)

## 2015-04-06 LAB — TROPONIN I: Troponin I: <0.03 ng/mL (ref <0.031)

## 2015-04-06 MED ORDER — SODIUM CHLORIDE 0.9 % IJ SOLN
3.0000 mL | Freq: Two times a day (BID) | INTRAMUSCULAR | Status: DC
  Administered 2015-04-07 (×2): 3 mL via INTRAVENOUS

## 2015-04-06 MED ORDER — VANCOMYCIN HCL IN DEXTROSE 1-5 GM/200ML-% IV SOLN
INTRAVENOUS | Status: AC
  Administered 2015-04-06: 1000 mg via INTRAVENOUS
  Filled 2015-04-06: qty 200

## 2015-04-06 MED ORDER — SODIUM CHLORIDE 0.9 % IV BOLUS (SEPSIS)
2080.0000 mL | Freq: Once | INTRAVENOUS | Status: AC
  Administered 2015-04-07: 1000 mL via INTRAVENOUS

## 2015-04-06 MED ORDER — ONDANSETRON HCL 4 MG/2ML IJ SOLN
INTRAMUSCULAR | Status: AC
  Administered 2015-04-06: 4 mg via INTRAVENOUS
  Filled 2015-04-06: qty 2

## 2015-04-06 MED ORDER — KETOROLAC TROMETHAMINE 30 MG/ML IJ SOLN
INTRAMUSCULAR | Status: AC
  Administered 2015-04-06: 30 mg via INTRAVENOUS
  Filled 2015-04-06: qty 1

## 2015-04-06 MED ORDER — ACETAMINOPHEN 500 MG PO TABS
1000.0000 mg | ORAL_TABLET | Freq: Once | ORAL | Status: AC
  Administered 2015-04-06: 1000 mg via ORAL

## 2015-04-06 MED ORDER — PIPERACILLIN-TAZOBACTAM 3.375 G IVPB
3.3750 g | Freq: Once | INTRAVENOUS | Status: AC
  Administered 2015-04-06: 3.375 g via INTRAVENOUS

## 2015-04-06 MED ORDER — ACETAMINOPHEN 650 MG RE SUPP
650.0000 mg | Freq: Four times a day (QID) | RECTAL | Status: DC | PRN
Start: 2015-04-06 — End: 2015-04-07

## 2015-04-06 MED ORDER — ACETAMINOPHEN 325 MG PO TABS
650.0000 mg | ORAL_TABLET | Freq: Four times a day (QID) | ORAL | Status: DC | PRN
Start: 2015-04-06 — End: 2015-04-07
  Administered 2015-04-07: 650 mg via ORAL
  Filled 2015-04-06: qty 2

## 2015-04-06 MED ORDER — ONDANSETRON HCL 4 MG PO TABS: 4.0000 mg | ORAL_TABLET | Freq: Four times a day (QID) | ORAL | Status: DC | PRN

## 2015-04-06 MED ORDER — ACETAMINOPHEN 500 MG PO TABS
ORAL_TABLET | ORAL | Status: AC
  Administered 2015-04-06: 1000 mg via ORAL
  Filled 2015-04-06: qty 2

## 2015-04-06 MED ORDER — SODIUM CHLORIDE 0.9 % IV BOLUS (SEPSIS)
1000.0000 mL | Freq: Once | INTRAVENOUS | Status: AC
  Administered 2015-04-06: 1000 mL via INTRAVENOUS

## 2015-04-06 MED ORDER — ONDANSETRON HCL 4 MG/2ML IJ SOLN
4.0000 mg | Freq: Once | INTRAMUSCULAR | Status: AC
  Administered 2015-04-06: 4 mg via INTRAVENOUS

## 2015-04-06 MED ORDER — KETOROLAC TROMETHAMINE 30 MG/ML IJ SOLN
30.0000 mg | Freq: Once | INTRAMUSCULAR | Status: AC
  Administered 2015-04-06: 30 mg via INTRAVENOUS

## 2015-04-06 MED ORDER — VANCOMYCIN HCL IN DEXTROSE 1-5 GM/200ML-% IV SOLN
1000.0000 mg | Freq: Once | INTRAVENOUS | Status: AC
  Administered 2015-04-06: 1000 mg via INTRAVENOUS

## 2015-04-06 MED ORDER — ONDANSETRON HCL 4 MG/2ML IJ SOLN: 4.0000 mg | Freq: Four times a day (QID) | INTRAMUSCULAR | Status: DC | PRN

## 2015-04-06 MED ORDER — SODIUM CHLORIDE 0.9 % IV SOLN
INTRAVENOUS | Status: AC
  Administered 2015-04-07: 01:00:00 via INTRAVENOUS

## 2015-04-06 MED ORDER — ENOXAPARIN SODIUM 40 MG/0.4ML ~~LOC~~ SOLN
40.0000 mg | SUBCUTANEOUS | Status: DC
  Administered 2015-04-07: 40 mg via SUBCUTANEOUS
  Filled 2015-04-06 (×3): qty 0.4

## 2015-04-06 MED ORDER — PIPERACILLIN-TAZOBACTAM 3.375 G IVPB
INTRAVENOUS | Status: AC
  Administered 2015-04-06: 3.375 g via INTRAVENOUS
  Filled 2015-04-06: qty 50

## 2015-04-06 NOTE — ED Provider Notes (Signed)
[facility] Emergency Department Provider Note  ____________________________________________  Time seen: Approximately 7:10 PM  I have reviewed the triage vital signs and the nursing notes.   HISTORY  Chief Complaint Weakness    HPI [name] is a 24 y.o. female history of diabetes, hypertension and migraines presents for evaluation of vomiting and syncope vs near syncope. Patient awoke earlier today and was feeling somewhat tired however she wanted to go with her family members to the [employer]. She was at the [facility] where she complained to her mother that she felt "weak" and while her parents were trying to get her out of the store, her legs buckled and she may have fainted. She did not fall, or hit her head as her family was nearby to catch her. After that she had several episodes of nonbloody nonbilious emesis. This occurred suddenly just prior to arrival. She feels nauseated at this time and is complaining of headache. Prior to today she had been in her usual state of health. She denies any chest pain, abdominal pain, difficulty breathing, no numbness or weakness. She denies any sore throat or ear pain.   Past Medical History  Diagnosis Date  . Hypertension     age 77    There are no active problems to display for this patient.   Past Surgical History  Procedure Laterality Date  . Cholecystectomy    . Tubes removed      Current Outpatient Rx  Name  Route  Sig  Dispense  Refill  . aspirin-acetaminophen-caffeine (EXCEDRIN MIGRAINE) [phone] MG per tablet   Oral   Take by mouth every 6 (six) hours as needed for headache.         . cephALEXin (KEFLEX) 500 MG capsule   Oral   Take 1 capsule (500 mg total) by mouth 4 (four) times daily.   20 capsule   0   . ibuprofen (ADVIL,MOTRIN) 800 MG tablet   Oral   Take 1 tablet (800 mg total) by mouth 3 (three) times daily.   21 tablet   0   . ondansetron  (ZOFRAN-ODT) 4 MG disintegrating tablet   Oral   Take 1 tablet (4 mg total) by mouth every 8 (eight) hours as needed for nausea.   12 tablet   0     Allergies Review of patient's allergies indicates not on file.  Family History  Problem Relation Age of Onset  . Hypertension Mother     Social History History  Substance Use Topics  . Smoking status: Current Some Day Smoker -- 0.50 packs/day  . Smokeless tobacco: Never Used  . Alcohol Use: Yes     Comment: occassionally    Review of Systems Constitutional: No fever/chills Eyes: No visual changes. ENT: No sore throat. Cardiovascular: Denies chest pain. Respiratory: Denies shortness of breath. Gastrointestinal: No abdominal pain.  + nausea, + vomiting.  No diarrhea.  No constipation. Genitourinary: Negative for dysuria. Musculoskeletal: Negative for back pain. Skin: Negative for rash. Neurological: Negative for headaches, focal weakness or numbness.  10-point ROS otherwise negative.  ____________________________________________   PHYSICAL EXAM: Filed Vitals:   04/06/15 1911  BP: 153/88  Pulse: 123  Temp: 102.4 F (39.1 C)  TempSrc: Oral  Height:  (1.727 m)  Weight: 300 lb (136.079 kg)  SpO2: 98%     Constitutional:lying in bed with eyes closed but eyes open to voice and light touch, she answers questions appropriately, follows all commands Eyes: Conjunctivae  are normal. PERRL. EOMI. Head: Atraumatic. Nose: No congestion/rhinnorhea. Mouth/Throat: Mucous membranes are moist.  Oropharynx non-erythematous. Neck: No stridor.  Supple without meningismus. Cardiovascular: tachycardic rate, regular rhythm. Grossly normal heart sounds.  Good peripheral circulation. Respiratory: Normal respiratory effort.  No retractions. Lungs CTAB. Gastrointestinal: Soft and nontender. No distention. No abdominal bruits. No CVA tenderness. Genitourinary: deferred Musculoskeletal: No lower extremity tenderness nor edema.  No  joint effusions. Neurologic:  Normal speech and language. No gross focal neurologic deficits are appreciated. Speech is normal. No gait instability.5 out of 5 strength in bilateral upper and lower extremity, sensation intact to light touch throughout. Skin:  Skin is warm, dry and intact. No rash noted. Psychiatric: Mood and affect are normal. Speech and behavior are normal.  ____________________________________________   LABS (all labs ordered are listed, but only abnormal results are displayed)  Labs Reviewed  CBC WITH DIFFERENTIAL/PLATELET - Abnormal; Notable for the following:    WBC 13.9 (*)    Neutro Abs 11.1 (*)    All other components within normal limits  COMPREHENSIVE METABOLIC PANEL - Abnormal; Notable for the following:    Sodium 133 (*)    Chloride 100 (*)    Glucose, Bld 268 (*)    AST 43 (*)    All other components within normal limits  URINALYSIS COMPLETEWITH MICROSCOPIC ([facility] ONLY) - Abnormal; Notable for the following:    Color, Urine YELLOW (*)    APPearance TURBID (*)    Glucose, UA >500 (*)    Hgb urine dipstick 1+ (*)    Protein, ur 30 (*)    Leukocytes, UA 1+ (*)    Bacteria, UA RARE (*)    All other components within normal limits  ACETAMINOPHEN LEVEL - Abnormal; Notable for the following:    Acetaminophen (Tylenol), Serum <10 (*)    All other components within normal limits  LACTIC ACID, PLASMA - Abnormal; Notable for the following:    Lactic Acid, Venous 2.9 (*)    All other components within normal limits  CULTURE, BLOOD (ROUTINE X 2)  CULTURE, BLOOD (ROUTINE X 2)  TROPONIN I  HCG, QUANTITATIVE, PREGNANCY  URINE DRUG SCREEN, QUALITATIVE ([facility] ONLY)  ETHANOL  SALICYLATE LEVEL  LACTIC ACID, PLASMA   ____________________________________________  EKG  ED ECG REPORT I, Gayla Doss, the attending physician, personally viewed and interpreted this ECG.   Date: 04/06/2015  EKG Time: 19:11  Rate: 124  Rhythm: sinus tachycardia  Axis:  normal  Intervals:none  ST&T Change: no acute ST segment elevation.  ____________________________________________  RADIOLOGY  CXR  FINDINGS: The heart size and mediastinal contours are within normal limits. Both lungs are clear. The visualized skeletal structures are unremarkable.  IMPRESSION: Normal chest.  CT head IMPRESSION: 1. No acute intracranial abnormality. 2. 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 There are no aggressive characteristics. If there is focal pain referable to this region, MRI without with contrast could be considered. Otherwise this is likely incidental and in the absence of clinical symptoms, no further follow-up is needed.  ____________________________________________   PROCEDURES  Procedure(s) performed: None  Critical Care performed: Yes, see critical care note(s). Total critical care time spent 40 minutes.  ____________________________________________   INITIAL IMPRESSION / ASSESSMENT AND PLAN / ED COURSE  Pertinent labs & imaging results that were available during my care of the patient were reviewed by me and considered in my medical  decision making (see chart for details).  [name] is a 24 y.o. female history of diabetes, hypertension and migraines presents for evaluation of vomiting and syncope vs near syncope. On arrival to the emergency department, she did appear lethargic however she responds to voice and light touch appropriately. She has never been "unresponsive" here. She is tachycardic and febrile. She has an intact neurological exam. Her neck is supple without meningismus, in fact her preferred method for answering questions is to nod the head "yes or no". Lungs are clear to auscultation bilaterally. Abdomen is soft, nontender nondistended.of her labs, urinalysis, blood cultures, CT head, IV fluids,  antibiotics and anti-pyretics.  ----------------------------------------- 8:58 PM on 04/06/2015 -----------------------------------------   At this time, the patient appears much improved. She is sitting up in bed, talkative, able to recount the events of the day, holding hands with boyfriend who is sitting at the edge of the bed. She is complaining of headache and back pain. She reports that her headache is consistent with development of her usual migraines. She is requesting ice chips. Labs are notable for leukocytosis, lactate elevated at 2.9. 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 May be developing viral illness however given tachycardia, leukocytosis, increased lactate, will give 30/kg bolus of normal saline, IV vancomycin IV Zosyn. I have discussed the case with the hospitalist for admission. ____________________________________________   FINAL CLINICAL IMPRESSION(S) / ED DIAGNOSES  Final diagnoses:  Non-intractable vomiting without nausea, vomiting of unspecified type  Near syncope  SIRS (systemic inflammatory response syndrome)      Gayla Doss, MD 04/08/15 2042

## 2015-04-06 NOTE — ED Notes (Signed)
Pt arrived via POV with mom. Pt found "unresponsive" in car;pulled from car and brought to room. MD at bedside. Pt responds to voice .

## 2015-04-06 NOTE — H&P (Signed)
[facility] - [place] at [facility]   PATIENT NAME: [name]    MR#:  [id number]  DATE OF BIRTH:  [birth year]  DATE OF ADMISSION:  04/06/2015  PRIMARY CARE PHYSICIAN: No primary care provider on file.   REQUESTING/REFERRING PHYSICIAN: Inocencio Homes  CHIEF COMPLAINT:   Chief Complaint  Patient presents with  . Weakness    HISTORY OF PRESENT ILLNESS:  [name]  is a 24 y.o. female who presents with fever, tachycardia, leukocytosis. Patient states that she woke up this morning in her usual state of health, decided to go with her family just before lunch time. Was out and about in different stores with her family, had some lunch at [facility] with the same. Later in the afternoon she was at a store with her family members and began to feel hot, and weak. She sat down to rest, but the feeling progressed and she became lightheaded. Per her report she had several syncopal episodes between this time and the time of her arrival the hospital. Her family initially gave her some candy, thinking her blood sugar might be low. This did not help her, she was able to get up and make it out of the car, thinking the sitting in the air conditioning in the car might help her. However she did not begin to feel better, she then proceeded to vomit at least 2 times. Patient states that at first she vomited clear substance, and then vomited up what she had eaten for her previous meal from [facility]. At this point the patient was brought by her family to the ED for evaluation. She denies chest pain, shortness of breath, abdominal pain, diarrhea, joint pains, rash, dysuria. See full review of systems below. In the ED she was found to be febrile, tachycardic, and elevated white count at 13.9. His initially treated for sepsis with fluid resuscitation and broad-spectrum antibiotics. However, laboratory and imaging evaluation has not elucidated source, with a negative chest x-ray, UA with  only 1+ leuk esterase. Her abdomen is not tender, it is soft, see exam below. Hospitalists were called for admission for surgery this, with possible sepsis though source at this time is undecided. Also for evaluation for her syncope.  PAST MEDICAL HISTORY:   Past Medical History  Diagnosis Date  . Hypertension     age 16  . Morbid obesity   . Migraine     PAST SURGICAL HISTORY:   Past Surgical History  Procedure Laterality Date  . Cholecystectomy    . Tubes removed      SOCIAL HISTORY:   History  Substance Use Topics  . Smoking status: Current Some Day Smoker -- 0.50 packs/day  . Smokeless tobacco: Never Used  . Alcohol Use: Yes     Comment: occassionally    FAMILY HISTORY:   Family History  Problem Relation Age of Onset  . Hypertension Mother     DRUG ALLERGIES:  Not on File  MEDICATIONS AT HOME:   Prior to Admission medications   Medication Sig Start Date End Date Taking? Authorizing Provider  aspirin-acetaminophen-caffeine (EXCEDRIN MIGRAINE) [phone] MG per tablet Take by mouth every 6 (six) hours as needed for headache.    Historical Provider, MD  cephALEXin (KEFLEX) 500 MG capsule Take 1 capsule (500 mg total) by mouth 4 (four) times daily. 10/24/14   Felicie Morn, NP  ibuprofen (ADVIL,MOTRIN) 800 MG tablet Take 1 tablet (800 mg total) by mouth 3 (three) times daily. 10/22/14   Melvenia Beam  Upstill, PA-C  ondansetron (ZOFRAN-ODT) 4 MG disintegrating tablet Take 1 tablet (4 mg total) by mouth every 8 (eight) hours as needed for nausea. 10/24/14   Felicie Morn, NP    REVIEW OF SYSTEMS:  Review of Systems  Constitutional: Positive for fever, chills and malaise/fatigue. Negative for weight loss.  HENT: Negative for ear pain, hearing loss and tinnitus.   Eyes: Negative for blurred vision, double vision, pain and redness.  Respiratory: Negative for cough, hemoptysis and shortness of breath.   Cardiovascular: Negative for chest pain, palpitations, orthopnea and leg  swelling.  Gastrointestinal: Positive for nausea and vomiting. Negative for abdominal pain, diarrhea and constipation.  Genitourinary: Negative for dysuria, frequency and hematuria.  Musculoskeletal: Positive for back pain and neck pain. Negative for joint pain.  Skin:       No acne, rash, or lesions  Neurological: Positive for loss of consciousness, weakness and headaches. Negative for dizziness, tremors and focal weakness.  Endo/Heme/Allergies: Negative for polydipsia. Does not bruise/bleed easily.  Psychiatric/Behavioral: Negative for depression. The patient is not nervous/anxious and does not have insomnia.      VITAL SIGNS:   Filed Vitals:   04/06/15 1911  BP: 153/88  Pulse: 123  Temp: 102.4 F (39.1 C)  TempSrc: Oral  Height: 5\' 8"  (1.727 m)  Weight: 136.079 kg (300 lb)  SpO2: 98%   Wt Readings from Last 3 Encounters:  04/06/15 136.079 kg (300 lb)  10/24/14 121.564 kg (268 lb)  10/22/14 121.564 kg (268 lb)    PHYSICAL EXAMINATION:  Physical Exam  Constitutional: She is oriented to person, place, and time. She appears well-developed and well-nourished. No distress.  HENT:  Head: Normocephalic and atraumatic.  Mouth/Throat: Oropharynx is clear and moist.  Eyes: Conjunctivae and EOM are normal. Pupils are equal, round, and reactive to light. No scleral icterus.  Neck: Normal range of motion. Neck supple. No JVD present. No thyromegaly present.  Neck is supple, with full range of motion, active and passive, with no resistance to passive range of motion. Kernig and Brudzinski signs negative  Cardiovascular: Regular rhythm and intact distal pulses.  Exam reveals no gallop and no friction rub.   No murmur heard. Tachycardic  Respiratory: Effort normal and breath sounds normal. No respiratory distress. She has no wheezes. She has no rales.  GI: Soft. Bowel sounds are normal. She exhibits no distension. There is no tenderness.  Musculoskeletal: Normal range of motion. She  exhibits no edema.  No arthritis, no gout  Lymphadenopathy:    She has no cervical adenopathy.  Neurological: She is alert and oriented to person, place, and time. No cranial nerve deficit.  No dysarthria, no aphasia  Skin: Skin is dry. No rash noted. No erythema.  Skin is very warm to touch  Psychiatric: She has a normal mood and affect. Her behavior is normal. Judgment and thought content normal.    LABORATORY PANEL:   CBC  Recent Labs Lab 04/06/15 1916  WBC 13.9*  HGB 13.7  HCT 40.4  PLT 183   ------------------------------------------------------------------------------------------------------------------  Chemistries   Recent Labs Lab 04/06/15 1916  NA 133*  K 3.7  CL 100*  CO2 22  GLUCOSE 268*  BUN 7  CREATININE 0.74  CALCIUM 8.9  AST 43*  ALT 49  ALKPHOS 60  BILITOT 0.8   ------------------------------------------------------------------------------------------------------------------  Cardiac Enzymes  Recent Labs Lab 04/06/15 1916  TROPONINI <0.03   ------------------------------------------------------------------------------------------------------------------  RADIOLOGY:  Ct Head Wo Contrast  04/06/2015   CLINICAL DATA:  Headache,  loss of consciousness.  EXAM: CT HEAD WITHOUT CONTRAST  TECHNIQUE: Contiguous axial images were obtained from the base of the skull through the vertex without intravenous contrast.  COMPARISON:  None.  FINDINGS: No intracranial hemorrhage, mass effect, or midline shift. No hydrocephalus. The basilar cisterns are patent. No evidence of territorial infarct. No intracranial fluid collection. There is no calvarial fracture. Within the left frontal bone is a 1.4 cm lesion with ground-glass matrix, well-defined borders and no extension to the inner table. Included paranasal sinuses and mastoid air cells are well aerated.  IMPRESSION: 1.  No acute intracranial abnormality. 2. Small focal 1.4 cm ground-glass lesion in the left  frontal bone. 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 There are no aggressive characteristics. If there is focal pain referable to this region, MRI without with contrast could be considered. Otherwise this is likely incidental and in the absence of clinical symptoms, no further follow-up is needed.   Electronically Signed   By: Rubye Oaks M.D.   On: 04/06/2015 21:11   Dg Chest Portable 1 View  04/06/2015   CLINICAL DATA:  Patient found unresponsive today.  EXAM: PORTABLE CHEST - 1 VIEW  COMPARISON:  None.  FINDINGS: The heart size and mediastinal contours are within normal limits. Both lungs are clear. The visualized skeletal structures are unremarkable.  IMPRESSION: Normal chest.   Electronically Signed   By: Francene Boyers M.D.   On: 04/06/2015 19:40    EKG:  No orders found for this or any previous visit.  IMPRESSION AND PLAN:  Principal Problem:   SIRS (systemic inflammatory response syndrome) - unclear etiology at this time. There is some suspicion for infection, though source is undecided so far. Urinary symptoms and only 1+ leuk esterase on UA, we'll send culture anyway. Blood cultures also sent. Chest x-ray negative. Still some possibility for some sort of abdominal infection, perhaps food poisoning or something of this sort. Although, other family members who ate at the same restaurant have not experienced any symptoms. There is some other concern for a different primary process, perhaps cardiac, especially given her syncope. See treatment for this below. We'll keep her on broad-spectrum antibiotic for now, continue fluid resuscitation, trend her lactic acid as her initial value was high at 2.9, and follow-up cultures. Active Problems:   Hyperglycemia - no formal diagnosis of diabetes, though she has elevated blood glucose on evaluation here. This is, however, in the setting of having had can be given to her by her family. We will check a  hemoglobin A1c while she is here.   Nausea and vomiting - seems to be stabilizing, though we will have antiemetics on board while she is here.   Migraine - she complains of a headache at this time, though she does not say specifically that is a migraine headache. She received Tylenol in the ED, and also has a dose of Toradol ordered for this.   Syncope - trend cardiac enzymes tonight, get an echocardiogram in the morning. Pending these results consider cardiology consult inpatient versus outpatient after discharge depending on how she does.   Essential hypertension - not on any antihypertensives at home, blood pressure elevated here, though this in the setting of seizures and tachycardia. We will use when necessary antihypertensives if necessary.  All the records are reviewed and case discussed with ED provider. Management plans discussed with the patient and/or family.  DVT PROPHYLAXIS: SubQ lovenox  ADMISSION STATUS: Observation  CODE STATUS: Full  TOTAL TIME TAKING CARE OF THIS PATIENT: 45 minutes.    [name] 04/06/2015, 9:27 PM  [facility]  Office  [phone]  CC: Primary care physician; No primary care provider on file.

## 2015-04-07 ENCOUNTER — Observation Stay
Admit: 2015-04-07 | Discharge: 2015-04-07 | Disposition: A | Discharge status: Home / Self Care | Payer: Medicaid Other | Attending: Internal Medicine | Admitting: Internal Medicine

## 2015-04-07 LAB — BASIC METABOLIC PANEL
ANION GAP: 6 (ref 5–15)
BUN: 7 mg/dL (ref 6–20)
CALCIUM: 8 mg/dL — AB (ref 8.9–10.3)
CO2: 25 mmol/L (ref 22–32)
Chloride: 105 mmol/L (ref 101–111)
Creatinine, Ser: 0.72 mg/dL (ref 0.44–1.00)
GLUCOSE: 284 mg/dL — AB (ref 65–99)
POTASSIUM: 3.6 mmol/L (ref 3.5–5.1)
Sodium: 136 mmol/L (ref 135–145)

## 2015-04-07 LAB — CBC
HCT: 34.4 % — ABNORMAL LOW (ref 35.0–47.0)
HCT: 35.1 % (ref 35.0–47.0)
HEMOGLOBIN: 11.7 g/dL — AB (ref 12.0–16.0)
Hemoglobin: 11.6 g/dL — ABNORMAL LOW (ref 12.0–16.0)
MCH: 29.1 pg (ref 26.0–34.0)
MCH: 29.1 pg (ref 26.0–34.0)
MCHC: 33.3 g/dL (ref 32.0–36.0)
MCHC: 33.6 g/dL (ref 32.0–36.0)
MCV: 86.6 fL (ref 80.0–100.0)
MCV: 87.5 fL (ref 80.0–100.0)
Platelets: 163 10*3/uL (ref 150–440)
Platelets: 152 10*3/uL (ref 150–440)
RBC: 3.97 MIL/uL (ref 3.80–5.20)
RBC: 4.02 MIL/uL (ref 3.80–5.20)
RDW: 13.6 % (ref 11.5–14.5)
RDW: 13.5 % (ref 11.5–14.5)
WBC: 12.2 10*3/uL — ABNORMAL HIGH (ref 3.6–11.0)
WBC: 11.4 10*3/uL — ABNORMAL HIGH (ref 3.6–11.0)

## 2015-04-07 LAB — GLUCOSE, CAPILLARY
GLUCOSE-CAPILLARY: 253 mg/dL — AB (ref 65–99)
GLUCOSE-CAPILLARY: 255 mg/dL — AB (ref 65–99)
GLUCOSE-CAPILLARY: 268 mg/dL — AB (ref 65–99)
GLUCOSE-CAPILLARY: 288 mg/dL — AB (ref 65–99)
Glucose-Capillary: 240 mg/dL — ABNORMAL HIGH (ref 65–99)

## 2015-04-07 LAB — MAGNESIUM: Magnesium: 1.5 mg/dL — ABNORMAL LOW (ref 1.7–2.4)

## 2015-04-07 LAB — CREATININE, SERUM: CREATININE: 0.78 mg/dL (ref 0.44–1.00)

## 2015-04-07 LAB — TROPONIN I
Troponin I: <0.03 ng/mL (ref <0.031)
Troponin I: <0.03 ng/mL (ref <0.031)

## 2015-04-07 LAB — TSH: TSH: 0.946 u[IU]/mL (ref 0.350–4.500)

## 2015-04-07 LAB — PHOSPHORUS: PHOSPHORUS: 3.7 mg/dL (ref 2.5–4.6)

## 2015-04-07 LAB — LACTIC ACID, PLASMA: Lactic Acid, Venous: 2.3 mmol/L (ref 0.5–2.0)

## 2015-04-07 MED ORDER — AMOXICILLIN-POT CLAVULANATE 875-125 MG PO TABS: 1.0000 | ORAL_TABLET | Freq: Two times a day (BID) | ORAL | Status: DC

## 2015-04-07 MED ORDER — AMOXICILLIN 500 MG PO CAPS: 500.0000 mg | ORAL_CAPSULE | Freq: Three times a day (TID) | ORAL | Status: DC

## 2015-04-07 MED ORDER — DOXYCYCLINE HYCLATE 100 MG PO TABS: 100.0000 mg | ORAL_TABLET | Freq: Two times a day (BID) | ORAL | Status: DC

## 2015-04-07 MED ORDER — AMOXICILLIN-POT CLAVULANATE 875-125 MG PO TABS
1.0000 | ORAL_TABLET | Freq: Two times a day (BID) | ORAL | Status: DC
  Administered 2015-04-07: 1 via ORAL
  Filled 2015-04-07: qty 1

## 2015-04-07 MED ORDER — PIPERACILLIN-TAZOBACTAM 3.375 G IVPB
3.3750 g | Freq: Three times a day (TID) | INTRAVENOUS | Status: DC
  Administered 2015-04-07: 3.375 g via INTRAVENOUS
  Filled 2015-04-07 (×5): qty 50

## 2015-04-07 MED ORDER — OXYCODONE-ACETAMINOPHEN 5-325 MG PO TABS
1.0000 | ORAL_TABLET | Freq: Four times a day (QID) | ORAL | Status: DC | PRN
  Administered 2015-04-07: 1 via ORAL
  Filled 2015-04-07: qty 1

## 2015-04-07 MED ORDER — MAGNESIUM SULFATE 4 GM/100ML IV SOLN
4.0000 g | Freq: Once | INTRAVENOUS | Status: AC
  Administered 2015-04-07: 4 g via INTRAVENOUS
  Filled 2015-04-07: qty 100

## 2015-04-07 MED ORDER — INSULIN ASPART 100 UNIT/ML ~~LOC~~ SOLN
0.0000 [IU] | Freq: Three times a day (TID) | SUBCUTANEOUS | Status: DC
  Administered 2015-04-07: 3 [IU] via SUBCUTANEOUS
  Administered 2015-04-07: 5 [IU] via SUBCUTANEOUS
  Filled 2015-04-07: qty 5
  Filled 2015-04-07: qty 3

## 2015-04-07 MED ORDER — SODIUM CHLORIDE 0.9 % IV SOLN
1250.0000 mg | Freq: Once | INTRAVENOUS | Status: AC
  Administered 2015-04-07: 1250 mg via INTRAVENOUS
  Filled 2015-04-07: qty 1250

## 2015-04-07 MED ORDER — VANCOMYCIN HCL 10 G IV SOLR
1250.0000 mg | Freq: Three times a day (TID) | INTRAVENOUS | Status: DC
  Filled 2015-04-07 (×4): qty 1250

## 2015-04-07 MED ORDER — DOXYCYCLINE HYCLATE 100 MG PO TABS
100.0000 mg | ORAL_TABLET | Freq: Two times a day (BID) | ORAL | Status: DC
  Administered 2015-04-07: 100 mg via ORAL
  Filled 2015-04-07: qty 1

## 2015-04-07 MED ORDER — INSULIN ASPART 100 UNIT/ML ~~LOC~~ SOLN: 0.0000 [IU] | Freq: Every day | SUBCUTANEOUS | Status: DC

## 2015-04-07 NOTE — Progress Notes (Signed)
[facility] - [place] at [facility]   PATIENT NAME: [name]    MR#:  [id number]  DATE OF BIRTH:  10/27/1990  SUBJECTIVE:  Patient complains of a fever and headache. Patient denies any vision changes photophobia. Patient denies tick bite. Patient denies any urine symptoms. Patient denies cough.  REVIEW OF SYSTEMS:    Review of Systems  Constitutional: Positive for fever and malaise/fatigue. Negative for chills.  HENT: Negative for congestion, nosebleeds and sore throat.   Eyes: Negative for blurred vision, double vision, photophobia, pain and discharge.  Respiratory: Negative for cough, hemoptysis, shortness of breath and wheezing.   Cardiovascular: Negative for chest pain, palpitations and leg swelling.  Gastrointestinal: Negative for nausea, vomiting, abdominal pain, diarrhea and blood in stool.  Genitourinary: Negative for dysuria, urgency and frequency.  Musculoskeletal: Negative for back pain.  Neurological: Positive for weakness and headaches. Negative for dizziness and tremors.  Endo/Heme/Allergies: Does not bruise/bleed easily.    Tolerating Diet: Yes      DRUG ALLERGIES:  No Known Allergies  VITALS:  Blood pressure 160/80, pulse 101, temperature 102.3 F (39.1 C), temperature source Oral, resp. rate 17, height 5\' 8"  (1.727 m), weight 116.257 kg (256 lb 4.8 oz), SpO2 100 %.  PHYSICAL EXAMINATION:   Physical Exam  Constitutional: She is oriented to person, place, and time and well-developed, well-nourished, and in no distress. No distress.  HENT:  Head: Normocephalic.  Bilateral tonsillar exudates  Eyes: No scleral icterus.  Neck: Normal range of motion. Neck supple. No JVD present. No tracheal deviation present.  Cardiovascular: Normal rate, regular rhythm and normal heart sounds.  Exam reveals no gallop and no friction rub.   No murmur heard. Pulmonary/Chest: Effort normal and breath sounds normal. No respiratory distress.  She has no wheezes. She has no rales. She exhibits no tenderness.  Abdominal: Soft. Bowel sounds are normal. She exhibits no distension and no mass. There is no tenderness. There is no rebound and no guarding.  Musculoskeletal: Normal range of motion. She exhibits no edema.  Neurological: She is alert and oriented to person, place, and time.  Skin: Skin is warm. No rash noted. No erythema.  Psychiatric: Affect and judgment normal.      LABORATORY PANEL:   CBC  Recent Labs Lab 04/07/15 0522  WBC 11.4*  HGB 11.7*  HCT 35.1  PLT 152   ------------------------------------------------------------------------------------------------------------------  Chemistries   Recent Labs Lab 04/06/15 1916 04/07/15 0043 04/07/15 0522  NA 133*  --  136  K 3.7  --  3.6  CL 100*  --  105  CO2 22  --  25  GLUCOSE 268*  --  284*  BUN 7  --  7  CREATININE 0.74 0.78 0.72  CALCIUM 8.9  --  8.0*  MG  --  1.5*  --   AST 43*  --   --   ALT 49  --   --   ALKPHOS 60  --   --   BILITOT 0.8  --   --    ------------------------------------------------------------------------------------------------------------------  Cardiac Enzymes  Recent Labs Lab 04/06/15 1916 04/07/15 0043 04/07/15 0522  TROPONINI <0.03 <0.03 <0.03   ------------------------------------------------------------------------------------------------------------------  RADIOLOGY:  Ct Head Wo Contrast  04/06/2015   CLINICAL DATA:  Headache, loss of consciousness.  EXAM: CT HEAD WITHOUT CONTRAST  TECHNIQUE: Contiguous axial images were obtained from the base of the skull through the vertex without intravenous contrast.  COMPARISON:  None.  FINDINGS: No intracranial hemorrhage, mass  effect, or midline shift. No hydrocephalus. The basilar cisterns are patent. No evidence of territorial infarct. No intracranial fluid collection. There is no calvarial fracture. Within the left frontal bone is a 1.4 cm lesion with ground-glass  matrix, well-defined borders and no extension to the inner table. Included paranasal sinuses and mastoid air cells are well aerated.  IMPRESSION: 1.  No acute intracranial abnormality. 2. 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 There are no aggressive characteristics. If there is focal pain referable to this region, MRI without with contrast could be considered. Otherwise this is likely incidental and in the absence of clinical symptoms, no further follow-up is needed.   Electronically Signed   By: Rubye Oaks M.D.   On: 04/06/2015 21:11   Dg Chest Portable 1 View  04/06/2015   CLINICAL DATA:  Patient found unresponsive today.  EXAM: PORTABLE CHEST - 1 VIEW  COMPARISON:  None.  FINDINGS: The heart size and mediastinal contours are within normal limits. Both lungs are clear. The visualized skeletal structures are unremarkable.  IMPRESSION: Normal chest.   Electronically Signed   By: Francene Boyers M.D.   On: 04/06/2015 19:40     ASSESSMENT AND PLAN:   This is a 24 year old female with no past medical history who presents with sepsis/fever.  1. Sepsis: Patient was admitted with the diagnosis of sepsis due to tachycardia and fever. Patient has a urinary tract infection she also has tonsillar exudates which is concerning for Streptococcus infection. I have changed her antibiotics to Augmentin and doxycycline (just in case she has had a tick bite). Augmentin should cover urinary tract infection and strep infection. I have ordered a strep culture.   2. Headache: Patient does not appear to have any meningeal signs. Her headache is likely from her fever. We'll try to add Percocet and see this helps. If this does not help I could add Midrin for possible migraine headache.  3. Syncope: Patient's troponins were negative. Telemetry is negative. Echocardiogram is pending at this time. 4.  Accelerated blood pressure: Likely in the setting of fevers and headache. Patient will need outpatient follow-up.  5. Obesity: Patient is encouraged to exercise as tolerated and watch her diet including a low-fat and low-cholesterol diet.  Management plans discussed with the patient and she is in agreement.  CODE STATUS: Full  TOTAL TIME TAKING CARE OF THIS PATIENT: 39 minutes.   Greater than 50% counseling and coordination of care  POSSIBLE D/C 1-2 days , DEPENDING ON CLINICAL CONDITION.   Suhail Peloquin M.D on 04/07/2015 at 11:23 AM  Between 7am to 6pm - Pager - [phone] After 6pm go to [email or web address] - password [email or web address]  [place] [place] [facility]  Office  [phone]  CC: Primary care physician; No primary care provider on file.

## 2015-04-07 NOTE — Progress Notes (Signed)
Dr. Juliene Pina aware of patient's current condition and vitals. Strep culture will take about 24 hours per lab. Family wishes to be discharged anyways - MD to complete orders and paperwork.  [name]

## 2015-04-07 NOTE — Progress Notes (Signed)
Patient given discharge teaching and paperwork regarding medications, diet, follow-up appointments and activity. Patient understanding verbalized. No complaints at this time. IV and telemetry discontinued. Skin assessment as previously charted and vitals are stable. Patient being discharged to home. Caregiver/family present during discharge teaching.  [name], [name]  

## 2015-04-07 NOTE — Consult Note (Signed)
  ANTIBIOTIC CONSULT NOTE - INITIAL  Pharmacy Consult for Vancomycin and Zosyn   Indication: SIRS  No Known Allergies  Patient Measurements: Height: 5\' 8"  (172.7 cm) Weight: 300 lb (136.079 kg) IBW/kg (Calculated) : 63.9 Adjusted Body Weight: 92.8 kg  Vital Signs: Temp: 98.9 F (37.2 C) (06/26 2327) Temp Source: Oral (06/26 2327) BP: 138/68 mmHg (06/26 2327) Pulse Rate: 106 (06/26 2327) Intake/Output from previous day:   Intake/Output from this shift:    Labs:  Recent Labs  04/06/15 1916  WBC 13.9*  HGB 13.7  PLT 183  CREATININE 0.74   Estimated Creatinine Clearance: 160.2 mL/min (by C-G formula based on Cr of 0.74).  Ke: 0.1, t-1/2: 6.93, Vd: 65L.   No results for input(s): VANCOTROUGH, VANCOPEAK, VANCORANDOM, GENTTROUGH, GENTPEAK, GENTRANDOM, TOBRATROUGH, TOBRAPEAK, TOBRARND, AMIKACINPEAK, AMIKACINTROU, AMIKACIN in the last 72 hours.   Microbiology: No results found for this or any previous visit (from the past 720 hour(s)).  Medical History: Past Medical History  Diagnosis Date  . Hypertension     age 24  . Morbid obesity   . Migraine     Medications:  Scheduled:  . enoxaparin (LOVENOX) injection  40 mg Subcutaneous Q24H  . piperacillin-tazobactam (ZOSYN)  IV  3.375 g Intravenous Once  . piperacillin-tazobactam (ZOSYN)  IV  3.375 g Intravenous Q8H  . sodium chloride  3 mL Intravenous Q12H  . vancomycin  1,250 mg Intravenous Once  . vancomycin  1,250 mg Intravenous Q8H   Infusions:  . sodium chloride     PRN: acetaminophen **OR** acetaminophen, ondansetron **OR** ondansetron (ZOFRAN) IV Anti-infectives    Start     Dose/Rate Route Frequency Ordered Stop   04/07/15 1100  vancomycin (VANCOCIN) 1,250 mg in sodium chloride 0.9 % 250 mL IVPB     1,250 mg 166.7 mL/hr over 90 Minutes Intravenous Every 8 hours 04/07/15 0038     04/07/15 0400  piperacillin-tazobactam (ZOSYN) IVPB 3.375 g    Comments:  Patient started Zosyn on 6/26.   3.375 g 12.5  mL/hr over 240 Minutes Intravenous Every 8 hours 04/07/15 0043     04/07/15 0300  vancomycin (VANCOCIN) 1,250 mg in sodium chloride 0.9 % 250 mL IVPB     1,250 mg 166.7 mL/hr over 90 Minutes Intravenous  Once 04/07/15 0038     04/06/15 2015  vancomycin (VANCOCIN) IVPB 1000 mg/200 mL premix     1,000 mg 200 mL/hr over 60 Minutes Intravenous  Once 04/06/15 2002 04/06/15 2210   04/06/15 2015  piperacillin-tazobactam (ZOSYN) IVPB 3.375 g     3.375 g 12.5 mL/hr over 240 Minutes Intravenous  Once 04/06/15 2002       Assessment: 24 y.o. female who presents with fever, tachycardia, leukocytosis, possible sepsis though source at this time is undecided.  Goal of Therapy:  Vancomycin trough level 15-20 mcg/ml  Plan:  Vancomycin 1g IV given in ED ~ 21:00.   Vancomycin 1250mg  IV ordered for 6/27 at 03:00 for stacked dosing.  Begin Vancomycin 1250mg  IV Q8H on 6/27 at 11:00.   Trough level ordered prior to 5th dose on 6/28 at 02:30.   Zosyn 3.375g EI Q8H ordered.   Follow up culture results  Stormy CardKatsoudas,Aleczander Fandino K, [facility] Center [facility] Santa FeRPH Clinical Pharmacist 04/07/2015,12:49 AM

## 2015-04-07 NOTE — Progress Notes (Addendum)
Notified Dr. Anne HahnWIllis of magnesium of 1.5 and lactic acid of 2.3. Magnesium IV ordered.

## 2015-04-07 NOTE — Progress Notes (Signed)
[facility] - [place] at [facility] was admitted to the Hospital on 04/06/2015 and Discharged  04/07/2015 and should be excused from work/school   for 5 days starting 04/06/2015 , may return to work/school without any restrictions.  Call Adrian Saran MD with questions.  Jakalyn Kratky M.D on 04/07/2015,at 9:10 AM  [facility] -  at [facility]  [phone]

## 2015-04-07 NOTE — Care Management (Signed)
Informed during progression that patient is for discharge home and there are no discharge needs.

## 2015-04-07 NOTE — Progress Notes (Signed)
A&O, Independent. Admitted with sepsis. On IV fluids. Room air. Tylenol given for pain. Family at side. IV antibiotics given. IV magnesium given for low mag.

## 2015-04-07 NOTE — Discharge Summary (Signed)
[facility] - [place] at [facility]   PATIENT NAME: [name]    MR#:  [id number]  DATE OF BIRTH:  [birth year]  DATE OF ADMISSION:  04/06/2015 ADMITTING PHYSICIAN: Oralia Manis, MD  DATE OF DISCHARGE: 04/07/2015 PRIMARY CARE PHYSICIAN: No primary care provider on file.    ADMISSION DIAGNOSIS:  SIRS (systemic inflammatory response syndrome) [A41.9] Near syncope [R55] Non-intractable vomiting without nausea, vomiting of unspecified type [R11.11]  DISCHARGE DIAGNOSIS:  Principal Problem:   SIRS (systemic inflammatory response syndrome) Active Problems:   Hyperglycemia   Nausea and vomiting   Migraine   Essential hypertension   Syncope   SECONDARY DIAGNOSIS:   Past Medical History  Diagnosis Date  . Hypertension     age 89  . Morbid obesity   . Migraine     HOSPITAL COURSE:  This is a 24 year old female with no past medical history who presents with sepsis/fever.  1. Sepsis: Patient was admitted with the diagnosis of sepsis due to tachycardia and fever. Patient has a urinary tract infection she also has tonsillar exudates which is concerning for Streptococcus infection. I have changed her antibiotics to Augmentin and doxycycline (just in case she has had a tick bite). Augmentin should cover urinary tract infection and strep infection. I have ordered a strep culture. Blood cultures are negative to date.  2. Headache: Patient does not appear to have any meningeal signs. Her headache is likely from her fever. We'll try to add Percocet and see this helps. If this does not help I could add Midrin for possible migraine headache.  3. Syncope: Patient's troponins were negative. Telemetry is negative. Echocardiogram is pending at this time.  4. Accelerated blood pressure: Likely in the setting of fevers and headache. Patient will need outpatient follow-up.  5. Obesity: Patient is encouraged to exercise as tolerated and watch her diet including a low-fat  and low-cholesterol diet.   Patient would like to go home today. I will follow-up on strep culture which should result tomorrow.   DISCHARGE CONDITIONS AND DIET:  Patient is being discharged home stable condition on a low-fat heart healthy diet  CONSULTS OBTAINED:     DRUG ALLERGIES:  No Known Allergies  DISCHARGE MEDICATIONS:   Current Discharge Medication List    START taking these medications   Details  amoxicillin-clavulanate (AUGMENTIN) 875-125 MG per tablet Take 1 tablet by mouth 2 (two) times daily. Qty: 20 tablet, Refills: 0    doxycycline (VIBRA-TABS) 100 MG tablet Take 1 tablet (100 mg total) by mouth every 12 (twelve) hours. Qty: 20 tablet, Refills: 0      CONTINUE these medications which have NOT CHANGED   Details  ibuprofen (ADVIL,MOTRIN) 800 MG tablet Take 1 tablet (800 mg total) by mouth 3 (three) times daily. Qty: 21 tablet, Refills: 0    norethindrone (MICRONOR,CAMILA,ERRIN) 0.35 MG tablet Take 1 tablet by mouth daily.              Today   CHIEF COMPLAINT:  Patient has a fever and headache. No vision changes. No neck stiffness. No back pain, cough or shortness of breath. No urinary symptoms.   VITAL SIGNS:  Blood pressure 160/80, pulse 101, temperature 102.3 F (39.1 C), temperature source Oral, resp. rate 17, height  (1.727 m), weight 116.257 kg (256 lb 4.8 oz), SpO2 100 %.   REVIEW OF SYSTEMS:  Review of Systems  Constitutional: Positive for fever. Negative for chills and malaise/fatigue.  HENT: Negative for sore throat.  Eyes: Negative for blurred vision.  Respiratory: Negative for cough, hemoptysis, shortness of breath and wheezing.   Cardiovascular: Negative for chest pain, palpitations and leg swelling.  Gastrointestinal: Negative for nausea, vomiting, abdominal pain, diarrhea and blood in stool.  Genitourinary: Negative for dysuria.  Musculoskeletal: Negative for back pain.  Neurological: Positive for headaches. Negative  for dizziness and tremors.  Endo/Heme/Allergies: Does not bruise/bleed easily.     PHYSICAL EXAMINATION:  GENERAL:  24 y.o.-year-old patient lying in the bed with no acute distress.  NECK:  Supple, no jugular venous distention. No thyroid enlargement, no tenderness.  LUNGS: Normal breath sounds bilaterally, no wheezing, rales,rhonchi  No use of accessory muscles of respiration.  CARDIOVASCULAR: S1, S2 normal. No murmurs, rubs, or gallops.  ABDOMEN: Soft, non-tender, non-distended. Bowel sounds present. No organomegaly or mass.  EXTREMITIES: No pedal edema, cyanosis, or clubbing.  PSYCHIATRIC: The patient is alert and oriented x 3.  SKIN: No obvious rash, lesion, or ulcer.   DATA REVIEW:   CBC  Recent Labs Lab 04/07/15 0522  WBC 11.4*  HGB 11.7*  HCT 35.1  PLT 152    Chemistries   Recent Labs Lab 04/06/15 1916 04/07/15 0043 04/07/15 0522  NA 133*  --  136  K 3.7  --  3.6  CL 100*  --  105  CO2 22  --  25  GLUCOSE 268*  --  284*  BUN 7  --  7  CREATININE 0.74 0.78 0.72  CALCIUM 8.9  --  8.0*  MG  --  1.5*  --   AST 43*  --   --   ALT 49  --   --   ALKPHOS 60  --   --   BILITOT 0.8  --   --     Cardiac Enzymes  Recent Labs Lab 04/06/15 1916 04/07/15 0043 04/07/15 0522  TROPONINI <0.03 <0.03 <0.03    Microbiology Results  @[email or web address] @  RADIOLOGY:  Ct Head Wo Contrast  04/06/2015   .  IMPRESSION: 1.  No acute intracranial abnormality. 2. 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 There are no aggressive characteristics. If there is focal pain referable to this region, MRI without with contrast could be considered. Otherwise this is likely incidental and in the absence of clinical symptoms, no further follow-up is needed.   Electronically Signed   By: Rubye Oaks M.D.   On: 04/06/2015 21:11   Dg Chest Portable 1 View  04/06/2015   CLINICAL  DATA:  Patient found unresponsive today.  EXAM: PORTABLE CHEST - 1 VIEW  COMPARISON:  None.  FINDINGS: The heart size and mediastinal contours are within normal limits. Both lungs are clear. The visualized skeletal structures are unremarkable.  IMPRESSION: Normal chest.   Electronically Signed   By: Francene Boyers M.D.   On: 04/06/2015 19:40      Management plans discussed with the patient and she is in agreement. Stable for discharge home  Patient should follow up with PCP in 1 week  CODE STATUS:     Code Status Orders        Start     Ordered   04/06/15 2334  Full code   Continuous     04/06/15 2333      TOTAL TIME TAKING CARE OF THIS PATIENT: 39 minutes.    Yan Okray M.D on 04/07/2015 at 11:47 AM  Between  7am to 6pm - Pager - [phone] After 6pm go to [email or web address] - password [email or web address] [facility]  [place] [facility]  Office  [phone]  CC: Primary care physician; No primary care provider on file.

## 2015-04-08 LAB — HEMOGLOBIN A1C: HEMOGLOBIN A1C: 9.4 % — AB (ref 4.0–6.0)

## 2015-04-09 LAB — CULTURE, GROUP A STREP (THRC)

## 2015-04-10 LAB — URINE CULTURE

## 2015-04-12 LAB — CULTURE, BLOOD (ROUTINE X 2)
CULTURE: NO GROWTH
Culture: NO GROWTH
Special Requests: NORMAL
Special Requests: NORMAL

## 2015-04-29 ENCOUNTER — Emergency Department (HOSPITAL_BASED_OUTPATIENT_CLINIC_OR_DEPARTMENT_OTHER)
Admission: EM | Admit: 2015-04-29 | Discharge: 2015-04-29 | Disposition: A | Discharge status: Home / Self Care | Payer: Medicaid Other | Attending: Emergency Medicine | Admitting: Emergency Medicine

## 2015-04-29 ENCOUNTER — Emergency Department (HOSPITAL_BASED_OUTPATIENT_CLINIC_OR_DEPARTMENT_OTHER): Payer: Medicaid Other

## 2015-04-29 ENCOUNTER — Encounter (HOSPITAL_BASED_OUTPATIENT_CLINIC_OR_DEPARTMENT_OTHER): Payer: Self-pay | Admitting: *Deleted

## 2015-04-29 DIAGNOSIS — Z793 Long term (current) use of hormonal contraceptives: Secondary | ICD-10-CM | POA: Insufficient documentation

## 2015-04-29 DIAGNOSIS — Z720 Tobacco use: Secondary | ICD-10-CM | POA: Insufficient documentation

## 2015-04-29 DIAGNOSIS — N390 Urinary tract infection, site not specified: Secondary | ICD-10-CM | POA: Insufficient documentation

## 2015-04-29 DIAGNOSIS — Z8742 Personal history of other diseases of the female genital tract: Secondary | ICD-10-CM | POA: Insufficient documentation

## 2015-04-29 DIAGNOSIS — Z792 Long term (current) use of antibiotics: Secondary | ICD-10-CM | POA: Insufficient documentation

## 2015-04-29 DIAGNOSIS — I10 Essential (primary) hypertension: Secondary | ICD-10-CM | POA: Insufficient documentation

## 2015-04-29 DIAGNOSIS — R1110 Vomiting, unspecified: Secondary | ICD-10-CM

## 2015-04-29 DIAGNOSIS — R1013 Epigastric pain: Secondary | ICD-10-CM | POA: Insufficient documentation

## 2015-04-29 DIAGNOSIS — R739 Hyperglycemia, unspecified: Secondary | ICD-10-CM

## 2015-04-29 DIAGNOSIS — Z3202 Encounter for pregnancy test, result negative: Secondary | ICD-10-CM | POA: Insufficient documentation

## 2015-04-29 DIAGNOSIS — R05 Cough: Secondary | ICD-10-CM | POA: Insufficient documentation

## 2015-04-29 HISTORY — DX: Unspecified ovarian cyst, unspecified side: N83.209

## 2015-04-29 LAB — LIPASE, BLOOD: Lipase: 24 U/L (ref 22–51)

## 2015-04-29 LAB — CBC
HCT: 37.8 % (ref 36.0–46.0)
Hemoglobin: 12.9 g/dL (ref 12.0–15.0)
MCH: 29.3 pg (ref 26.0–34.0)
MCHC: 34.1 g/dL (ref 30.0–36.0)
MCV: 85.7 fL (ref 78.0–100.0)
Platelets: 234 10*3/uL (ref 150–400)
RBC: 4.41 MIL/uL (ref 3.87–5.11)
RDW: 12.6 % (ref 11.5–15.5)
WBC: 13.9 10*3/uL — AB (ref 4.0–10.5)

## 2015-04-29 LAB — URINALYSIS, ROUTINE W REFLEX MICROSCOPIC
Glucose, UA: 250 mg/dL — AB
KETONES UR: 15 mg/dL — AB
Nitrite: NEGATIVE
PH: 5 (ref 5.0–8.0)
PROTEIN: 30 mg/dL — AB
Specific Gravity, Urine: 1.026 (ref 1.005–1.030)
Urobilinogen, UA: 0.2 mg/dL (ref 0.0–1.0)

## 2015-04-29 LAB — COMPREHENSIVE METABOLIC PANEL
ALK PHOS: 61 U/L (ref 38–126)
ALT: 31 U/L (ref 14–54)
AST: 20 U/L (ref 15–41)
Albumin: 3.8 g/dL (ref 3.5–5.0)
Anion gap: 10 (ref 5–15)
BILIRUBIN TOTAL: 1.1 mg/dL (ref 0.3–1.2)
BUN: 7 mg/dL (ref 6–20)
CO2: 23 mmol/L (ref 22–32)
Calcium: 8.7 mg/dL — ABNORMAL LOW (ref 8.9–10.3)
Chloride: 100 mmol/L — ABNORMAL LOW (ref 101–111)
Creatinine, Ser: 0.59 mg/dL (ref 0.44–1.00)
GFR calc Af Amer: >60 mL/min (ref >60)
GFR calc non Af Amer: >60 mL/min (ref >60)
Glucose, Bld: 261 mg/dL — ABNORMAL HIGH (ref 65–99)
Potassium: 3.6 mmol/L (ref 3.5–5.1)
SODIUM: 133 mmol/L — AB (ref 135–145)
TOTAL PROTEIN: 8 g/dL (ref 6.5–8.1)

## 2015-04-29 LAB — CBG MONITORING, ED: Glucose-Capillary: 247 mg/dL — ABNORMAL HIGH (ref 65–99)

## 2015-04-29 LAB — URINE MICROSCOPIC-ADD ON

## 2015-04-29 LAB — OCCULT BLOOD X 1 CARD TO LAB, STOOL: Fecal Occult Bld: NEGATIVE

## 2015-04-29 LAB — PREGNANCY, URINE: Preg Test, Ur: NEGATIVE

## 2015-04-29 MED ORDER — CEPHALEXIN 500 MG PO CAPS: 500.0000 mg | ORAL_CAPSULE | Freq: Four times a day (QID) | ORAL | Status: DC

## 2015-04-29 MED ORDER — FAMOTIDINE IN NACL 20-0.9 MG/50ML-% IV SOLN
20.0000 mg | Freq: Once | INTRAVENOUS | Status: AC
  Administered 2015-04-29: 20 mg via INTRAVENOUS
  Filled 2015-04-29: qty 50

## 2015-04-29 MED ORDER — ONDANSETRON HCL 4 MG/2ML IJ SOLN
4.0000 mg | Freq: Once | INTRAMUSCULAR | Status: AC
  Administered 2015-04-29: 4 mg via INTRAVENOUS
  Filled 2015-04-29: qty 2

## 2015-04-29 MED ORDER — SODIUM CHLORIDE 0.9 % IV BOLUS (SEPSIS)
1000.0000 mL | Freq: Once | INTRAVENOUS | Status: AC
  Administered 2015-04-29: 1000 mL via INTRAVENOUS

## 2015-04-29 MED ORDER — ONDANSETRON HCL 4 MG PO TABS: 4.0000 mg | ORAL_TABLET | Freq: Four times a day (QID) | ORAL | Status: DC

## 2015-04-29 MED ORDER — METFORMIN HCL 500 MG PO TABS: 500.0000 mg | ORAL_TABLET | Freq: Two times a day (BID) | ORAL | Status: DC

## 2015-04-29 MED ORDER — CEPHALEXIN 250 MG PO CAPS
500.0000 mg | ORAL_CAPSULE | Freq: Once | ORAL | Status: AC
  Administered 2015-04-29: 500 mg via ORAL
  Filled 2015-04-29: qty 2

## 2015-04-29 NOTE — Discharge Instructions (Signed)
Return to the ED with any concerns including vomiting and not able to keep down liquids or antibitoics, difficulty breathing, fainting, decreased level of alertness/lethargy, or any other alarming symptoms

## 2015-04-29 NOTE — ED Notes (Addendum)
Pt requests to speak with md prior to d/c, dr. Karma GanjaLinker alerted.

## 2015-04-29 NOTE — ED Notes (Signed)
Patient and father states the patient woke up this morning feeling ok.  States approximately 30 minutes pta, she started coughing and begin to vomit due to the cough.  States the emesis is bright red in color.  History of sinus drainage that causes her to vomit.

## 2015-04-29 NOTE — ED Notes (Signed)
Pt states she is unable to void at this time, given cup and instructions for cc urine sample. Will notify staff when she is able to produce sample.

## 2015-04-29 NOTE — ED Provider Notes (Signed)
CSN: [id number]     Arrival date & time 04/29/15  0746 History   First MD Initiated Contact with Patient 04/29/15 [phone]     Chief Complaint  Patient presents with  . Cough     (Consider location/radiation/quality/duration/timing/severity/associated sxs/prior Treatment) HPI  Pt presenting with c/o cough and vomiting.  She states she felt fine when she woke up this morning, but then developed cough which led to vomiting.  She saw bright red blood in the toilet- unclear if she coughed this up or vomited.  C/o epigastric pain.  Continues to feel nauseated.  No changes in stool.  No fever/chills.  Pt does have a harsh cough followed by emesis in the ED.  No syncope, no fever/chills.  No sick contacts.  There are no other associated systemic symptoms, there are no other alleviating or modifying factors.   Past Medical History  Diagnosis Date  . Hypertension     age 70  . Morbid obesity   . Migraine   . Ovarian cyst    Past Surgical History  Procedure Laterality Date  . Cholecystectomy    . Tubes removed     Family History  Problem Relation Age of Onset  . Hypertension Mother    History  Substance Use Topics  . Smoking status: Current Some Day Smoker -- 3.00 packs/day    Types: Cigarettes  . Smokeless tobacco: Never Used  . Alcohol Use: Yes     Comment: occassionally   OB History    No data available     Review of Systems  ROS reviewed and all otherwise negative except for mentioned in HPI    Allergies  Review of patient's allergies indicates no known allergies.  Home Medications   Prior to Admission medications   Medication Sig Start Date End Date Taking? Authorizing Provider  norethindrone (MICRONOR,CAMILA,ERRIN) 0.35 MG tablet Take 1 tablet by mouth daily.   Yes Historical Provider, MD  amoxicillin-clavulanate (AUGMENTIN) 875-125 MG per tablet Take 1 tablet by mouth 2 (two) times daily. 04/07/15   Adrian Saran, MD  cephALEXin (KEFLEX) 500 MG capsule Take 1 capsule (500  mg total) by mouth 4 (four) times daily. 04/29/15   Jerelyn Scott, MD  doxycycline (VIBRA-TABS) 100 MG tablet Take 1 tablet (100 mg total) by mouth every 12 (twelve) hours. 04/07/15   Adrian Saran, MD  ibuprofen (ADVIL,MOTRIN) 800 MG tablet Take 1 tablet (800 mg total) by mouth 3 (three) times daily. 10/22/14   Elpidio Anis, PA-C  metFORMIN (GLUCOPHAGE) 500 MG tablet Take 1 tablet (500 mg total) by mouth 2 (two) times daily with a meal. 04/29/15   Jerelyn Scott, MD  ondansetron (ZOFRAN) 4 MG tablet Take 1 tablet (4 mg total) by mouth every 6 (six) hours. 04/29/15   Jerelyn Scott, MD   BP 138/83 mmHg  Pulse 84  Temp(Src) 98.4 F (36.9 C) (Oral)  Resp 16  Ht  (1.727 m)  Wt 253 lb (114.76 kg)  BMI 38.48 kg/m2  SpO2 100%  Vitals reviewed Physical Exam  Physical Examination: General appearance - alert, well appearing, and in no distress Mental status - alert, oriented to person, place, and time Eyes - no conjunctival injection, no scleral icterus Mouth - mucous membranes moist, pharynx normal without lesions Chest - clear to auscultation, no wheezes, rales or rhonchi, symmetric air entry Heart - normal rate, regular rhythm, normal S1, S2, no murmurs, rubs, clicks or gallops Abdomen - soft, nontender, nondistended, no masses or organomegaly, nabs Back exam -  full range of motion, no tenderness, palpable spasm or pain on motion Neurological - alert, oriented x 3, normal speech, moving all extremities Extremities - peripheral pulses normal, no pedal edema, no clubbing or cyanosis Skin - normal coloration and turgor, no rashes  ED Course  Procedures (including critical care time) Labs Review Labs Reviewed  CBC - Abnormal; Notable for the following:    WBC 13.9 (*)    All other components within normal limits  COMPREHENSIVE METABOLIC PANEL - Abnormal; Notable for the following:    Sodium 133 (*)    Chloride 100 (*)    Glucose, Bld 261 (*)    Calcium 8.7 (*)    All other components  within normal limits  URINALYSIS, ROUTINE W REFLEX MICROSCOPIC (NOT AT [facility]) - Abnormal; Notable for the following:    Color, Urine AMBER (*)    APPearance TURBID (*)    Glucose, UA 250 (*)    Hgb urine dipstick TRACE (*)    Bilirubin Urine SMALL (*)    Ketones, ur 15 (*)    Protein, ur 30 (*)    Leukocytes, UA SMALL (*)    All other components within normal limits  URINE MICROSCOPIC-ADD ON - Abnormal; Notable for the following:    Squamous Epithelial / LPF FEW (*)    Bacteria, UA MANY (*)    Casts GRANULAR CAST (*)    All other components within normal limits  CBG MONITORING, ED - Abnormal; Notable for the following:    Glucose-Capillary 247 (*)    All other components within normal limits  LIPASE, BLOOD  OCCULT BLOOD X 1 CARD TO LAB, STOOL  PREGNANCY, URINE    Imaging Review Dg Abd Acute W/chest  04/29/2015   CLINICAL DATA:  Cough and vomiting  EXAM: DG ABDOMEN ACUTE W/ 1V CHEST  COMPARISON:  December 24, 2010  FINDINGS: PA chest: Lungs are clear. Heart size and pulmonary vascularity are normal. No adenopathy. No bone lesions.  Supine and upright abdomen: There is moderate stool throughout the colon. There is no bowel dilatation or air-fluid level suggesting obstruction. No free air. There are surgical clips in the right upper quadrant.  IMPRESSION: Bowel gas pattern unremarkable.  Lungs clear.   Electronically Signed   By: Bretta BangWilliam  Woodruff III M.D.   On: 04/29/2015 09:43     EKG Interpretation None      MDM   Final diagnoses:  Vomiting  UTI (lower urinary tract infection)  Hyperglycemia    Pt presenting with cough and vomiting.  Labs reveal UTI- she was recently treated with augmentin- will place on keflex for this.  Will also add metformin- per prior chart review her blood sugar has been consistently elevated- HGB a1c at last admission was 9.4.  D/w patient that it is very important for her to f/u with PMD and to get blood sugar regulated.  hemouccult negative, doubt  serious GI bleed.  Blood in emesis may be from wretching and gagging with vomiting.  Abdominal exam is benign, pt feels improved after IV fluids and zofran.  Discussed all results and plan with patient, she verbalizes understanding of the importance of PMD followup.  Discharged with strict return precautions.  Pt agreeable with plan.    Jerelyn ScottMartha Linker, MD 04/29/15 [phone]

## 2015-04-29 NOTE — ED Notes (Signed)
MD at bedside. 

## 2015-06-10 ENCOUNTER — Encounter (HOSPITAL_BASED_OUTPATIENT_CLINIC_OR_DEPARTMENT_OTHER): Payer: Self-pay | Admitting: *Deleted

## 2015-06-10 ENCOUNTER — Emergency Department (HOSPITAL_BASED_OUTPATIENT_CLINIC_OR_DEPARTMENT_OTHER)
Admission: EM | Admit: 2015-06-10 | Discharge: 2015-06-10 | Disposition: A | Discharge status: Home / Self Care | Payer: Medicaid Other | Attending: Emergency Medicine | Admitting: Emergency Medicine

## 2015-06-10 DIAGNOSIS — I10 Essential (primary) hypertension: Secondary | ICD-10-CM | POA: Insufficient documentation

## 2015-06-10 DIAGNOSIS — K088 Other specified disorders of teeth and supporting structures: Secondary | ICD-10-CM | POA: Insufficient documentation

## 2015-06-10 DIAGNOSIS — Z720 Tobacco use: Secondary | ICD-10-CM | POA: Insufficient documentation

## 2015-06-10 DIAGNOSIS — Z79899 Other long term (current) drug therapy: Secondary | ICD-10-CM | POA: Insufficient documentation

## 2015-06-10 DIAGNOSIS — K0889 Other specified disorders of teeth and supporting structures: Secondary | ICD-10-CM

## 2015-06-10 DIAGNOSIS — Z792 Long term (current) use of antibiotics: Secondary | ICD-10-CM | POA: Insufficient documentation

## 2015-06-10 DIAGNOSIS — R51 Headache: Secondary | ICD-10-CM | POA: Insufficient documentation

## 2015-06-10 DIAGNOSIS — E119 Type 2 diabetes mellitus without complications: Secondary | ICD-10-CM | POA: Insufficient documentation

## 2015-06-10 DIAGNOSIS — Z8742 Personal history of other diseases of the female genital tract: Secondary | ICD-10-CM | POA: Insufficient documentation

## 2015-06-10 HISTORY — DX: Type 2 diabetes mellitus without complications: E11.9

## 2015-06-10 LAB — CBG MONITORING, ED: Glucose-Capillary: 179 mg/dL — ABNORMAL HIGH (ref 65–99)

## 2015-06-10 MED ORDER — HYDROCODONE-ACETAMINOPHEN 5-325 MG PO TABS: 1.0000 | ORAL_TABLET | Freq: Four times a day (QID) | ORAL | Status: DC | PRN

## 2015-06-10 MED ORDER — PENICILLIN V POTASSIUM 250 MG PO TABS: 250.0000 mg | ORAL_TABLET | Freq: Four times a day (QID) | ORAL | Status: AC

## 2015-06-10 NOTE — Discharge Instructions (Signed)
Dental Pain A tooth ache may be caused by cavities (tooth decay). Cavities expose the nerve of the tooth to air and hot or cold temperatures. It may come from an infection or abscess (also called a boil or furuncle) around your tooth. It is also often caused by dental caries (tooth decay). This causes the pain you are having. DIAGNOSIS  Your caregiver can diagnose this problem by exam. TREATMENT   If caused by an infection, it may be treated with medications which kill germs (antibiotics) and pain medications as prescribed by your caregiver. Take medications as directed.  Only take over-the-counter or prescription medicines for pain, discomfort, or fever as directed by your caregiver.  Whether the tooth ache today is caused by infection or dental disease, you should see your dentist as soon as possible for further care. SEEK MEDICAL CARE IF: The exam and treatment you received today has been provided on an emergency basis only. This is not a substitute for complete medical or dental care. If your problem worsens or new problems (symptoms) appear, and you are unable to meet with your dentist, call or return to this location. SEEK IMMEDIATE MEDICAL CARE IF:   You have a fever.  You develop redness and swelling of your face, jaw, or neck.  You are unable to open your mouth.  You have severe pain uncontrolled by pain medicine. MAKE SURE YOU:   Understand these instructions.  Will watch your condition.  Will get help right away if you are not doing well or get worse. Document Released: 09/27/2005 Document Revised: 12/20/2011 Document Reviewed: 05/15/2008 [facility] Patient Information 2015 [place], [place]. This information is not intended to replace advice given to you by your health care provider. Make sure you discuss any questions you have with your health care provider.    Dental Care: Organization         Address  Phone  Notes  [facility] Department of [facility] [address] [place], [place] [phone] Accepts children up to age 71 who are enrolled in [place] or [place] Health Choice; pregnant women with a Medicaid card; and children who have applied for Medicaid or [place] Health Choice, but were declined, whose parents can pay a reduced fee at time of service.  [facility] Department of [facility]  [address], [place] [phone] Accepts children up to age 56 who are enrolled in [place] or [place] Health Choice; pregnant women with a Medicaid card; and children who have applied for Medicaid or [place] Health Choice, but were declined, whose parents can pay a reduced fee at time of service.  [facility]  [address] [place], [place] [phone] Patients are seen by appointment only. Walk-ins are not accepted. [facility] will see patients 79 years of age and older. Monday - Tuesday (8am-5pm) Most Wednesdays (8:30-5pm) $30 per visit, cash only  [facility]  [address], [facility] [phone] Patients are seen by appointment only. Walk-ins are not accepted. [facility] will see patients 62 years of age and older. One Wednesday Evening (Monthly: Volunteer Based).  $30 per visit, cash only  [employer]  [phone] for adults; Children under age 2, call Graduate Pediatric Dentistry at [phone]. Children aged 20-14, please call [phone] to request a pediatric application.  Dental services are provided in all areas of dental care including fillings, crowns and bridges,  complete and partial dentures, implants, gum treatment, root canals, and extractions. Preventive care is also provided. Treatment is provided to both adults and children. Patients are selected via a lottery and there is often a waiting list.   [facility] [address] [place]  [phone] [email or web address]   [facility]  [address] [place], [place] [phone], Ext. [phone] Second and Fourth Thursday of each month, opens at 6:30 AM; Clinic ends at 9 AM.  Patients are seen on a first-come first-served basis, and a limited number are seen during each clinic.   [facility]  [address] [place], [place] [phone]   Eligibility Requirements You must have lived in [place], [place], or [place] counties for at least the last three months.   You cannot be eligible for state or federal sponsored [place], including [employer], [place], or [employer].   You generally cannot be eligible for healthcare insurance through your employer.    How to apply: Eligibility screenings are held every Tuesday and Wednesday afternoon from 1:00 pm until 4:00 pm. You do not need an appointment for the interview!  [facility] [address] [place], [place] [phone]   [facility] Health Department  [phone]   [facility] Health Department  [phone]   [facility] Health Department  [phone]

## 2015-06-10 NOTE — ED Notes (Signed)
States the right side of her face has been painful for the past few days. She is not sure if she has a bad tooth or not.

## 2015-06-10 NOTE — ED Provider Notes (Signed)
CSN: [id number]     Arrival date & time 06/10/15  1531 History   First MD Initiated Contact with Patient 06/10/15 1540     Chief Complaint  Patient presents with  . Facial Pain     (Consider location/radiation/quality/duration/timing/severity/associated sxs/prior Treatment) Patient is a 24 y.o. female presenting with tooth pain. The history is provided by the patient.  Dental Pain Location:  Upper Upper teeth location:  1/RU 3rd molar Quality:  Localized, pressure-like, sharp and throbbing Severity:  Severe Onset quality:  Gradual Duration:  2 days Timing:  Constant Progression:  Worsening Chronicity:  New Context: not dental fracture, not malocclusion and not trauma   Relieved by:  Nothing Worsened by:  Cold food/drink Ineffective treatments:  NSAIDs Associated symptoms: facial pain   Associated symptoms: no congestion, no difficulty swallowing, no drooling, no facial swelling, no fever and no neck pain   Associated symptoms comment:  Nausea.  No abdominal pain or vomiting Risk factors: diabetes, lack of dental care and smoking   Risk factors: no alcohol problem     Past Medical History  Diagnosis Date  . Hypertension     age 12  . Morbid obesity   . Migraine   . Ovarian cyst   . Diabetes mellitus without complication    Past Surgical History  Procedure Laterality Date  . Cholecystectomy    . Tubes removed     Family History  Problem Relation Age of Onset  . Hypertension Mother    Social History  Substance Use Topics  . Smoking status: Current Some Day Smoker -- 3.00 packs/day    Types: Cigarettes  . Smokeless tobacco: Never Used  . Alcohol Use: Yes     Comment: occassionally   OB History    No data available     Review of Systems  Constitutional: Negative for fever.  HENT: Negative for congestion, drooling and facial swelling.   Musculoskeletal: Negative for neck pain.  All other systems reviewed and are negative.     Allergies  Review of  patient's allergies indicates no known allergies.  Home Medications   Prior to Admission medications   Medication Sig Start Date End Date Taking? Authorizing Provider  amoxicillin-clavulanate (AUGMENTIN) 875-125 MG per tablet Take 1 tablet by mouth 2 (two) times daily. 04/07/15   Adrian Saran, MD  cephALEXin (KEFLEX) 500 MG capsule Take 1 capsule (500 mg total) by mouth 4 (four) times daily. 04/29/15   Jerelyn Scott, MD  doxycycline (VIBRA-TABS) 100 MG tablet Take 1 tablet (100 mg total) by mouth every 12 (twelve) hours. 04/07/15   Adrian Saran, MD  HYDROcodone-acetaminophen (NORCO/VICODIN) 5-325 MG per tablet Take 1-2 tablets by mouth every 6 (six) hours as needed. 06/10/15   Gwyneth Sprout, MD  ibuprofen (ADVIL,MOTRIN) 800 MG tablet Take 1 tablet (800 mg total) by mouth 3 (three) times daily. 10/22/14   Elpidio Anis, PA-C  metFORMIN (GLUCOPHAGE) 500 MG tablet Take 1 tablet (500 mg total) by mouth 2 (two) times daily with a meal. 04/29/15   Jerelyn Scott, MD  norethindrone (MICRONOR,CAMILA,ERRIN) 0.35 MG tablet Take 1 tablet by mouth daily.    Historical Provider, MD  ondansetron (ZOFRAN) 4 MG tablet Take 1 tablet (4 mg total) by mouth every 6 (six) hours. 04/29/15   Jerelyn Scott, MD  penicillin v potassium (VEETID) 250 MG tablet Take 1 tablet (250 mg total) by mouth 4 (four) times daily. 06/10/15 06/17/15  Gwyneth Sprout, MD   BP 127/80 mmHg  Pulse 84  Temp(Src)  98.3 F (36.8 C) (Oral)  Resp 20  Ht  (1.727 m)  Wt 251 lb (113.853 kg)  BMI 38.17 kg/m2  SpO2 100% Physical Exam  Constitutional: She is oriented to person, place, and time. She appears well-developed and well-nourished. No distress.  HENT:  Head: Normocephalic and atraumatic.  Right Ear: Tympanic membrane and ear canal normal.  Left Ear: Tympanic membrane and ear canal normal.  Mouth/Throat: Oropharynx is clear and moist. No trismus in the jaw. No uvula swelling.    No facial swelling  Eyes: Conjunctivae and EOM are  normal. Pupils are equal, round, and reactive to light.  Neck: Normal range of motion. Neck supple.  Cardiovascular: Normal rate, regular rhythm and intact distal pulses.   No murmur heard. Pulmonary/Chest: Effort normal and breath sounds normal. No respiratory distress. She has no wheezes. She has no rales.  Abdominal: Soft. She exhibits no distension. There is no tenderness. There is no rebound and no guarding.  Musculoskeletal: Normal range of motion. She exhibits no edema or tenderness.  Neurological: She is alert and oriented to person, place, and time.  Skin: Skin is warm and dry. No rash noted. No erythema.  Psychiatric: She has a normal mood and affect. Her behavior is normal.  Nursing note and vitals reviewed.   ED Course  Procedures (including critical care time) Labs Review Labs Reviewed  CBG MONITORING, ED - Abnormal; Notable for the following:    Glucose-Capillary 179 (*)    All other components within normal limits    Imaging Review No results found. I have personally reviewed and evaluated these images and lab results as part of my medical decision-making.   EKG Interpretation None      MDM   Final diagnoses:  Pain, dental    Pt with dental carry without facial swelling.  No signs of ludwig's angina or difficulty swallowing and no systemic symptoms. Will treat with PCN and have pt f/u with dentist.  Patient with a known history of diabetes he's currently on metformin. She is checked her sugar in approximately one week however blood sugar today was 170. Patient states it's much better than her averages which are in the 200s. She does complain of some mild nausea today but denies any fever or vomiting. No abdominal pain at this time.     Gwyneth Sprout, MD 06/10/15 [phone]

## 2015-06-27 ENCOUNTER — Encounter (HOSPITAL_BASED_OUTPATIENT_CLINIC_OR_DEPARTMENT_OTHER): Payer: Self-pay

## 2015-06-27 ENCOUNTER — Emergency Department (HOSPITAL_BASED_OUTPATIENT_CLINIC_OR_DEPARTMENT_OTHER)
Admission: EM | Admit: 2015-06-27 | Discharge: 2015-06-27 | Disposition: A | Discharge status: Home / Self Care | Payer: Medicaid Other | Attending: Emergency Medicine | Admitting: Emergency Medicine

## 2015-06-27 DIAGNOSIS — N390 Urinary tract infection, site not specified: Secondary | ICD-10-CM

## 2015-06-27 DIAGNOSIS — E119 Type 2 diabetes mellitus without complications: Secondary | ICD-10-CM | POA: Insufficient documentation

## 2015-06-27 DIAGNOSIS — Z79899 Other long term (current) drug therapy: Secondary | ICD-10-CM | POA: Insufficient documentation

## 2015-06-27 DIAGNOSIS — R112 Nausea with vomiting, unspecified: Secondary | ICD-10-CM

## 2015-06-27 DIAGNOSIS — I10 Essential (primary) hypertension: Secondary | ICD-10-CM | POA: Insufficient documentation

## 2015-06-27 DIAGNOSIS — Z3202 Encounter for pregnancy test, result negative: Secondary | ICD-10-CM | POA: Insufficient documentation

## 2015-06-27 DIAGNOSIS — Z720 Tobacco use: Secondary | ICD-10-CM | POA: Insufficient documentation

## 2015-06-27 LAB — URINE MICROSCOPIC-ADD ON

## 2015-06-27 LAB — URINALYSIS, ROUTINE W REFLEX MICROSCOPIC
BILIRUBIN URINE: NEGATIVE
GLUCOSE, UA: 100 mg/dL — AB
Ketones, ur: NEGATIVE mg/dL
Nitrite: NEGATIVE
PH: 5 (ref 5.0–8.0)
Protein, ur: NEGATIVE mg/dL
SPECIFIC GRAVITY, URINE: 1.029 (ref 1.005–1.030)
Urobilinogen, UA: 0.2 mg/dL (ref 0.0–1.0)

## 2015-06-27 LAB — PREGNANCY, URINE: Preg Test, Ur: NEGATIVE

## 2015-06-27 MED ORDER — ONDANSETRON 4 MG PO TBDP: ORAL_TABLET | ORAL | Status: DC

## 2015-06-27 MED ORDER — ONDANSETRON 4 MG PO TBDP
4.0000 mg | ORAL_TABLET | Freq: Once | ORAL | Status: AC
  Administered 2015-06-27: 4 mg via ORAL
  Filled 2015-06-27: qty 1

## 2015-06-27 MED ORDER — CEPHALEXIN 500 MG PO CAPS: 500.0000 mg | ORAL_CAPSULE | Freq: Three times a day (TID) | ORAL | Status: DC

## 2015-06-27 NOTE — Discharge Instructions (Signed)
Urinary Tract Infection °Urinary tract infections (UTIs) can develop anywhere along your urinary tract. Your urinary tract is your body's drainage system for removing wastes and extra water. Your urinary tract includes two kidneys, two ureters, a bladder, and a urethra. Your kidneys are a pair of bean-shaped organs. Each kidney is about the size of your fist. They are located below your ribs, one on each side of your spine. °CAUSES °Infections are caused by microbes, which are microscopic organisms, including fungi, viruses, and bacteria. These organisms are so small that they can only be seen through a microscope. Bacteria are the microbes that most commonly cause UTIs. °SYMPTOMS  °Symptoms of UTIs may vary by age and gender of the patient and by the location of the infection. Symptoms in young women typically include a frequent and intense urge to urinate and a painful, burning feeling in the bladder or urethra during urination. Older women and men are more likely to be tired, shaky, and weak and have muscle aches and abdominal pain. A fever may mean the infection is in your kidneys. Other symptoms of a kidney infection include pain in your back or sides below the ribs, nausea, and vomiting. °DIAGNOSIS °To diagnose a UTI, your caregiver will ask you about your symptoms. Your caregiver also will ask to provide a urine sample. The urine sample will be tested for bacteria and white blood cells. White blood cells are made by your body to help fight infection. °TREATMENT  °Typically, UTIs can be treated with medication. Because most UTIs are caused by a bacterial infection, they usually can be treated with the use of antibiotics. The choice of antibiotic and length of treatment depend on your symptoms and the type of bacteria causing your infection. °HOME CARE INSTRUCTIONS °· If you were prescribed antibiotics, take them exactly as your caregiver instructs you. Finish the medication even if you feel better after you  have only taken some of the medication. °· Drink enough water and fluids to keep your urine clear or pale yellow. °· Avoid caffeine, tea, and carbonated beverages. They tend to irritate your bladder. °· Empty your bladder often. Avoid holding urine for long periods of time. °· Empty your bladder before and after sexual intercourse. °· After a bowel movement, women should cleanse from front to back. Use each tissue only once. °SEEK MEDICAL CARE IF:  °· You have back pain. °· You develop a fever. °· Your symptoms do not begin to resolve within 3 days. °SEEK IMMEDIATE MEDICAL CARE IF:  °· You have severe back pain or lower abdominal pain. °· You develop chills. °· You have nausea or vomiting. °· You have continued burning or discomfort with urination. °MAKE SURE YOU:  °· Understand these instructions. °· Will watch your condition. °· Will get help right away if you are not doing well or get worse. °Document Released: 07/07/2005 Document Revised: 03/28/2012 Document Reviewed: 11/05/2011 °ExitCare® Patient Information ©2015 [employer]. This information is not intended to replace advice given to you by your health care provider. Make sure you discuss any questions you have with your health care provider. ° ° °Emergency Department Resource Guide °1) Find a Doctor and Pay Out of Pocket °Although you won't have to find out who is covered by your insurance plan, it is a good idea to ask around and get recommendations. You will then need to call the office and see if the doctor you have chosen will accept you as a new patient and what types of   options they offer for patients who are self-pay. Some doctors offer discounts or will set up payment plans for their patients who do not have insurance, but you will need to ask so you aren't surprised when you get to your appointment. ° °2) Contact Your Local Health Department °Not all health departments have doctors that can see patients for sick visits, but many do, so it is worth  a call to see if yours does. If you don't know where your local health department is, you can check in your phone book. The CDC also has a tool to help you locate your state's health department, and many state websites also have listings of all of their local health departments. ° °3) Find a Walk-in Clinic °If your illness is not likely to be very severe or complicated, you may want to try a walk in clinic. These are popping up all over the country in pharmacies, drugstores, and shopping centers. They're usually staffed by nurse practitioners or physician assistants that have been trained to treat common illnesses and complaints. They're usually fairly quick and inexpensive. However, if you have serious medical issues or chronic medical problems, these are probably not your best option. ° °No Primary Care Doctor: °- Call [facility] at  832-8000 - they can help you locate a primary care doctor that  accepts your insurance, provides certain services, etc. °- Physician Referral Service- 1-800-533-3463 ° °Chronic Pain Problems: °Organization         Address  Phone   Notes  ° [facility]  (336) 297-2271 Patients need to be referred by their primary care doctor.  ° °Medication Assistance: °Organization         Address  Phone   Notes  °[place] [facility] [address] °[place], [place] [address] (336) 641-8030 --Must be a resident of [place] °-- Must have NO insurance coverage whatsoever (no Medicaid/ Medicare, etc.) °-- The pt. MUST have a primary care doctor that directs their care regularly and follows them in the community °  °[facility]  (866) 331-1348   °[employer]  (888) 892-1162   ° °Agencies that provide inexpensive medical care: °Organization         Address  Phone   Notes  °[facility]  (336) 832-8035   °[facility]    (336) 832-7272   °[facility] [address] °[place], [place]  [address] (336) 832-4777   °[facility] [address], °[place] (336) 271-4999   °[facility]    (336) 373-0678   °[facility]    (336) 272-1050   °[facility] ° [address], [place] Phone:  (336) 832-4444, Fax:  (336) 832-4440 Hours of Operation:  9 am - 6 pm, M-F.  Also accepts Medicaid/Medicare and self-pay.  °[facility] ° [address],  Phone: (336) 832-3150, Fax: (336) 832-3151. Hours of Operation:  8:30 am - 5:30 pm, M-F.  Also accepts Medicaid and self-pay.  °[facility] [place] [address], [place] Phone: (336) 878-6027   °[facility] [address], [place], [place] (336)723-1848, Ext. 123 Mondays & Thursdays: 7-9 AM.  First 15 patients are seen on a first come, first serve basis. °  ° °Medicaid-accepting [place] Providers: ° °Organization         Address  Phone   Notes  °  [facility] [address], [place] (336) 641-2100 Also accepts self-pay patients.  °[facility] [address], [place] ° (336) 856-9996   °[facility] [address], [place] (336) 288-8857   °[facility] [address] [place] [address], [place] (336) 299-7000   °Veita Bland [address], [place]  ° (336) 373-1557 Only accepts [place] Access Medicaid patients after they have their name applied to their card.  ° °Self-Pay (no insurance) in [place]: ° °Organization         Address  Phone   Notes  °Sickle Cell Patients, [facility] [address], [place] (336) 832-1970   °[facility] [address], [place] (336) 832-4400   °[facility] ° [address],  (336) 992-4800   °[facility]/Dr. Osei-Bonsu ° [address] [place] [address], [place] or [address], [place] (336) 841-8500 Phone number for both [place] and [place] locations is the same.  °[facility] [address], [place] (336) 299-0000   °[facility] [address] [place] [address], [place] or [address] (336) 852-7530 °(336) 878-2260   °[facility] [address], [place] (336) 350-1642, phone; (336) 294-5005, fax Sees patients 1st and 3rd Saturday of every month.  Must not qualify for public or private insurance (i.e. Medicaid, Medicare, [place] Health Choice, Veterans' Benefits) • Household income should be no more than 200% of the poverty level •The clinic cannot treat you if you are pregnant or think you are pregnant • Sexually transmitted diseases are not treated at the clinic.  ° ° °Dental Care: °Organization         Address  Phone  Notes  °[facility] [address], [place] (336) 641-6152 Accepts children up to age 21 who are enrolled in Medicaid or [place] Health Choice; pregnant women with a Medicaid card; and children who have applied for Medicaid or [place] Health Choice, but were declined, whose parents can pay a reduced fee at time of service.  °[facility] [place]  [address], [place] (336) 641-7733 Accepts children up to age 21 who are enrolled in Medicaid or [place] Health Choice; pregnant women with a Medicaid card; and children who have applied for Medicaid or [place] Health Choice, but were declined, whose parents can pay a reduced fee at time of service.  °[facility] ° [address],  (336) 641-4533 Patients are seen by appointment only. Walk-ins are not accepted. [facility] will see patients 18 years of age and older. °Monday - Tuesday (8am-5pm) °Most Wednesdays (8:30-5pm) °$30 per visit, cash only  °[facility] ° [address], [place] (336) 641-4533  Patients are seen by appointment only. Walk-ins are not accepted. [facility] will see patients 18 years of age and older. °One Wednesday Evening (Monthly: Volunteer Based).  $30 per visit, cash only  °[facility]  (919) 537-3737 for adults; Children under age 4, call Graduate Pediatric Dentistry at (919) [phone]. Children aged 4-14, please call (919) [phone] to request a pediatric application. ° Dental services are provided in all areas of dental care including fillings, crowns and bridges, complete and partial   dentures, implants, gum treatment, root canals, and extractions. Preventive care is also provided. Treatment is provided to both adults and children. °Patients are selected via a lottery and there is often a waiting list. °  °[facility] [address], °[place] ° (336) 763-8833 [email or web address] °  °[facility] [address], [place], [place] (336)723-1848, Ext. 123 Second and Fourth Thursday of each month, opens at 6:30 AM; Clinic ends at 9 AM.  Patients are seen on a first-come first-served basis, and a limited number are seen during each clinic.  ° °[facility] ° [address], [place], [place] (336) 723-7904   Eligibility Requirements °You must have lived in [place], [place], or [place] counties for at least the last three months. °  You cannot be eligible for state or federal sponsored healthcare insurance, including Veterans Administration, Medicaid, or Medicare. °  You generally cannot be eligible for healthcare insurance through your employer.  °  How to apply: °Eligibility screenings are held every Tuesday and Wednesday afternoon from 1:00 pm until 4:00 pm. You do not need an appointment for the interview!  °[facility] [address], [place], [place] 336-631-2330   °[facility]  336-342-8273   °[facility]  336-703-3100   °[facility]   336-570-6415   ° °Behavioral Health Resources in the Community: °Intensive Outpatient Programs °Organization         Address  Phone  Notes  °[facility] [address], [place], [place] 336-878-6098   °[facility] [address], [place], [place] 336-832-9800   °[facility] [address], [place], [place] ° 336-882-2125   °[facility] [address],  °[place], [place] 1-800-853-5163 or 336-641-4981   °Substance Abuse Resources °Organization         Address  Phone  Notes  °[facility]  336-882-2125   °[facility]  336-784-9470   °[facility]  336-285-9073   °[facility]  336-845-3988   °Residential & Outpatient Substance Abuse Program  1-800-659-3381   °Psychological Services °Organization         Address  Phone  Notes  °[facility]  336- 832-9600   °[facility]  336- 378-7881   °[facility] [address], [place] 1-800-853-5163 or 336-641-4981   ° °Mobile Crisis Teams °Organization         Address  Phone  Notes  °[facility], Mobile Crisis Care Unit  1-877-626-1772   °Assertive °[facility] ° [address] [place], [place] 336-834-9664   °Sharon DeEsch [address] °[place] 336-554-5454   ° °Self-Help/Support Groups °Organization         Address  Phone             Notes  °[facility] - variety of support groups  336- 373-1402 Call for more information  °Narcotics Anonymous (NA), [facility] [address], °[place]  2 meetings at this location  ° °Residential Treatment Programs °Organization         Address  Phone  Notes  °[facility] [address],    °[place]  1-866-801-8205   °[facility] ° [address], [place],  704-293-8524   °[facility] [address], [place] 336-845-3988 Admissions: 8am-3pm M-F   °  [facility] [address]    °[place], [place] 336-841-1104   °[facility] [address], [place], [place] 336-379-7146   °[facility] [address]  °[place], [place] 336-285-9073   °[facility] - Intensive Outpatient [address], [place], [place] 336-852-3033   °[facility] [address]  °[place], [place] 1-877-615-2722 or 336-784-9470   °[facility] [address] [place], [place] 336-227-7417 Accepts Medicaid  °[facility] [address]  ° [place] 1-800-659-3381 Substance Abuse/Addiction Treatment  ° °[place] Behavioral Health Resources °Organization         Address  Phone  Notes  °[facility]  (888) 581-9988   °Julie Brannon, PhD [address], [place]   (336) 349-5553 or (336) 951-0000   °[facility]   [address] °[place], [place] (336) 349-4454   °[facility] [address], [place], [place] (336) 342-8316 Insurance/Medicaid/sponsorship through [facility]  °[facility] [address]                                    [place], [place] (336) 342-8316 Therapy/tele-psych/case  °[facility] [address]  ° [place], [place] (336) 349-2233    °Dr. Arfeen  (336) 349-4544   °[facility] [place]  [employer] [place] [facility] 1) [address], [place] °2) [address], [place] °3)  [address], [place] (336) 349-3220 °(336) 342-7768 ° °(336) 342-8140   °[place] [facility] (336) 342-1394 or (336) 342-3537 (After Hours)    ° ° ° ° °

## 2015-06-27 NOTE — ED Notes (Signed)
C/o vomiting since this am-states emesis has had "little bit of blood" each time

## 2015-06-27 NOTE — ED Notes (Addendum)
Pt eating potato chips in ED.  Tolerating well.

## 2015-06-27 NOTE — ED Provider Notes (Addendum)
CSN: [id number]     Arrival date & time 06/27/15  1538 History  This chart was scribe for [name], MD by Angelene Giovanni, ED Scribe. The patient was seen in room MH10/MH10 and the patient's care was started at 6:17 PM.    Chief Complaint  Patient presents with  . Emesis   Patient is a 24 y.o. female presenting with vomiting. The history is provided by the patient. No language interpreter was used.  Emesis Severity:  Moderate Duration:  10 hours Timing:  Intermittent Number of daily episodes:  3 Progression:  Unchanged Chronicity:  New Relieved by:  None tried Worsened by:  Nothing tried Ineffective treatments:  None tried Associated symptoms: abdominal pain and chills   Associated symptoms: no diarrhea and no fever   Risk factors: not pregnant now    HPI Comments: [name] is a 24 y.o. female with a hx of ovarian cyst, and DM without complications who presents to the Emergency Department complaining of nausea and vomiting onset 8:30 this am. She reports associated moderate sharp intermittent left abdominal pain that has been constant in last few hours and streaks of blood in her throat. She denies any fever but adds that she has intermittent chills. She also denies vaginal discharge, or vaginal bleeding. She also denies feeling any symptoms prior to bed. She states that she normally gets a UTI monthly.  Past Medical History  Diagnosis Date  . Hypertension     age 17  . Morbid obesity   . Migraine   . Ovarian cyst   . Diabetes mellitus without complication    Past Surgical History  Procedure Laterality Date  . Cholecystectomy    . Tubes removed     Family History  Problem Relation Age of Onset  . Hypertension Mother    Social History  Substance Use Topics  . Smoking status: Current Some Day Smoker -- 3.00 packs/day    Types: Cigarettes  . Smokeless tobacco: Never Used  . Alcohol Use: Yes     Comment: occassionally   OB History    No data available      Review of Systems  Constitutional: Positive for chills.  Gastrointestinal: Positive for nausea, vomiting and abdominal pain. Negative for diarrhea.  All other systems reviewed and are negative.     Allergies  Review of patient's allergies indicates no known allergies.  Home Medications   Prior to Admission medications   Medication Sig Start Date End Date Taking? Authorizing Provider  metFORMIN (GLUCOPHAGE) 500 MG tablet Take 1 tablet (500 mg total) by mouth 2 (two) times daily with a meal. 04/29/15   Jerelyn Scott, MD  norethindrone (MICRONOR,CAMILA,ERRIN) 0.35 MG tablet Take 1 tablet by mouth daily.    Historical Provider, MD   BP 126/71 mmHg  Pulse 91  Temp(Src) 98.6 F (37 C) (Oral)  Resp 18  Ht 5\' 8"  (1.727 m)  Wt 245 lb (111.131 kg)  BMI 37.26 kg/m2  SpO2 100%  LMP 06/20/2015 Physical Exam  Constitutional: She is oriented to person, place, and time. She appears well-developed and well-nourished. No distress.  HENT:  Head: Normocephalic and atraumatic.  Mouth/Throat: Oropharynx is clear and moist.  Eyes: EOM are normal. Pupils are equal, round, and reactive to light.  Neck: Normal range of motion. Neck supple.  Cardiovascular: Normal rate and regular rhythm.  Exam reveals no friction rub.   No murmur heard. Pulmonary/Chest: Effort normal and breath sounds normal. No respiratory distress. She has no wheezes. She  has no rales.  Abdominal: Soft. She exhibits no distension. There is tenderness (mild, LLQ). There is no rebound.  Musculoskeletal: Normal range of motion. She exhibits no edema.  Neurological: She is alert and oriented to person, place, and time.  Skin: She is not diaphoretic.  Nursing note and vitals reviewed.   ED Course  Procedures (including critical care time) DIAGNOSTIC STUDIES: Oxygen Saturation is 100% on RA, normal by my interpretation.    COORDINATION OF CARE: 6:20 PM- Pt advised of plan for treatment and pt agrees.    Labs Review Labs  Reviewed  URINALYSIS, ROUTINE W REFLEX MICROSCOPIC (NOT AT [facility]) - Abnormal; Notable for the following:    APPearance CLOUDY (*)    Glucose, UA 100 (*)    Hgb urine dipstick TRACE (*)    Leukocytes, UA MODERATE (*)    All other components within normal limits  URINE MICROSCOPIC-ADD ON - Abnormal; Notable for the following:    Squamous Epithelial / LPF FEW (*)    Bacteria, UA MANY (*)    All other components within normal limits  PREGNANCY, URINE    Imaging Review No results found. I have personally reviewed and evaluated these images and lab results as part of my medical decision-making.   EKG Interpretation None      MDM   Final diagnoses:  UTI (lower urinary tract infection)  Non-intractable vomiting with nausea, vomiting of unspecified type    24 year old female here with nausea vomiting. Has had similar symptoms of UTI. Has mild left lower quadrant pain but no suprapubic tenderness. Denies urinary or vaginal complaints. Urine shows moderate leukocytes 11-20 white cells. We'll treat for UTI. Given ODT Zofran with good results.  I personally performed the services described in this documentation, which was scribed in my presence. The recorded information has been reviewed and is accurate. 1   [name], MD 06/28/15 0932  [name], MD 07/16/15 [phone]

## 2015-06-27 NOTE — ED Notes (Signed)
Pt states she woke up this morning with nausea. Vomited 3 times today, states there was scant amount of blood in vomit.

## 2015-07-19 ENCOUNTER — Emergency Department (HOSPITAL_BASED_OUTPATIENT_CLINIC_OR_DEPARTMENT_OTHER)
Admission: EM | Admit: 2015-07-19 | Discharge: 2015-07-19 | Disposition: A | Discharge status: Home / Self Care | Payer: Medicaid Other | Attending: Emergency Medicine | Admitting: Emergency Medicine

## 2015-07-19 ENCOUNTER — Encounter (HOSPITAL_BASED_OUTPATIENT_CLINIC_OR_DEPARTMENT_OTHER): Payer: Self-pay | Admitting: Emergency Medicine

## 2015-07-19 DIAGNOSIS — E119 Type 2 diabetes mellitus without complications: Secondary | ICD-10-CM | POA: Insufficient documentation

## 2015-07-19 DIAGNOSIS — R000 Tachycardia, unspecified: Secondary | ICD-10-CM | POA: Insufficient documentation

## 2015-07-19 DIAGNOSIS — Z79899 Other long term (current) drug therapy: Secondary | ICD-10-CM | POA: Insufficient documentation

## 2015-07-19 DIAGNOSIS — I10 Essential (primary) hypertension: Secondary | ICD-10-CM | POA: Insufficient documentation

## 2015-07-19 DIAGNOSIS — R1084 Generalized abdominal pain: Secondary | ICD-10-CM | POA: Insufficient documentation

## 2015-07-19 DIAGNOSIS — Z3202 Encounter for pregnancy test, result negative: Secondary | ICD-10-CM | POA: Insufficient documentation

## 2015-07-19 DIAGNOSIS — Z720 Tobacco use: Secondary | ICD-10-CM | POA: Insufficient documentation

## 2015-07-19 DIAGNOSIS — R112 Nausea with vomiting, unspecified: Secondary | ICD-10-CM | POA: Insufficient documentation

## 2015-07-19 DIAGNOSIS — Z792 Long term (current) use of antibiotics: Secondary | ICD-10-CM | POA: Insufficient documentation

## 2015-07-19 DIAGNOSIS — R197 Diarrhea, unspecified: Secondary | ICD-10-CM | POA: Insufficient documentation

## 2015-07-19 DIAGNOSIS — Z8742 Personal history of other diseases of the female genital tract: Secondary | ICD-10-CM | POA: Insufficient documentation

## 2015-07-19 LAB — CBC WITH DIFFERENTIAL/PLATELET
Basophils Absolute: 0 10*3/uL (ref 0.0–0.1)
Basophils Relative: 0 %
EOS PCT: 1 %
Eosinophils Absolute: 0.1 10*3/uL (ref 0.0–0.7)
HEMATOCRIT: 43 % (ref 36.0–46.0)
Hemoglobin: 14.5 g/dL (ref 12.0–15.0)
LYMPHS ABS: 1.6 10*3/uL (ref 0.7–4.0)
LYMPHS PCT: 13 %
MCH: 29.2 pg (ref 26.0–34.0)
MCHC: 33.7 g/dL (ref 30.0–36.0)
MCV: 86.5 fL (ref 78.0–100.0)
MONO ABS: 0.9 10*3/uL (ref 0.1–1.0)
MONOS PCT: 7 %
NEUTROS ABS: 9.5 10*3/uL — AB (ref 1.7–7.7)
Neutrophils Relative %: 79 %
PLATELETS: 260 10*3/uL (ref 150–400)
RBC: 4.97 MIL/uL (ref 3.87–5.11)
RDW: 13 % (ref 11.5–15.5)
WBC: 12.1 10*3/uL — ABNORMAL HIGH (ref 4.0–10.5)

## 2015-07-19 LAB — COMPREHENSIVE METABOLIC PANEL
ALBUMIN: 4 g/dL (ref 3.5–5.0)
ALT: 23 U/L (ref 14–54)
AST: 22 U/L (ref 15–41)
Alkaline Phosphatase: 54 U/L (ref 38–126)
Anion gap: 10 (ref 5–15)
BILIRUBIN TOTAL: 1.2 mg/dL (ref 0.3–1.2)
BUN: 13 mg/dL (ref 6–20)
CHLORIDE: 101 mmol/L (ref 101–111)
CO2: 21 mmol/L — AB (ref 22–32)
Calcium: 8.6 mg/dL — ABNORMAL LOW (ref 8.9–10.3)
Creatinine, Ser: 0.85 mg/dL (ref 0.44–1.00)
GFR calc Af Amer: >60 mL/min (ref >60)
GFR calc non Af Amer: >60 mL/min (ref >60)
GLUCOSE: 186 mg/dL — AB (ref 65–99)
POTASSIUM: 3.4 mmol/L — AB (ref 3.5–5.1)
SODIUM: 132 mmol/L — AB (ref 135–145)
TOTAL PROTEIN: 8.3 g/dL — AB (ref 6.5–8.1)

## 2015-07-19 LAB — URINALYSIS, ROUTINE W REFLEX MICROSCOPIC
Glucose, UA: NEGATIVE mg/dL
Hgb urine dipstick: NEGATIVE
Ketones, ur: NEGATIVE mg/dL
Nitrite: NEGATIVE
Protein, ur: NEGATIVE mg/dL
Specific Gravity, Urine: 1.031 — ABNORMAL HIGH (ref 1.005–1.030)
Urobilinogen, UA: 0.2 mg/dL (ref 0.0–1.0)
pH: 5 (ref 5.0–8.0)

## 2015-07-19 LAB — URINE MICROSCOPIC-ADD ON

## 2015-07-19 LAB — PREGNANCY, URINE: Preg Test, Ur: NEGATIVE

## 2015-07-19 MED ORDER — SODIUM CHLORIDE 0.9 % IV BOLUS (SEPSIS)
1000.0000 mL | Freq: Once | INTRAVENOUS | Status: AC
  Administered 2015-07-19: 1000 mL via INTRAVENOUS

## 2015-07-19 MED ORDER — ONDANSETRON 4 MG PO TBDP: ORAL_TABLET | ORAL | Status: DC

## 2015-07-19 MED ORDER — ACETAMINOPHEN 325 MG PO TABS
650.0000 mg | ORAL_TABLET | Freq: Once | ORAL | Status: AC
  Administered 2015-07-19: 650 mg via ORAL
  Filled 2015-07-19: qty 2

## 2015-07-19 MED ORDER — LOPERAMIDE HCL 2 MG PO CAPS: 2.0000 mg | ORAL_CAPSULE | Freq: Four times a day (QID) | ORAL | Status: DC | PRN

## 2015-07-19 NOTE — ED Notes (Signed)
Pt states nausea and vomiting since yesterday and diarrhea started today.  Along with stabbing pain in diffuse middle abdomen.

## 2015-07-19 NOTE — Discharge Instructions (Signed)
Your symptoms are likely due to a viral process. It is important for you to stay well hydrated and drink plenty of water or Gatorade. Take your nausea medicines as needed for your discomfort. Take your loperamide for diarrhea. Follow-up with your doctor in 3 days for reevaluation. Return to ED for worsening symptoms.  Diarrhea Diarrhea is frequent loose and watery bowel movements. It can cause you to feel weak and dehydrated. Dehydration can cause you to become tired and thirsty, have a dry mouth, and have decreased urination that often is dark yellow. Diarrhea is a sign of another problem, most often an infection that will not last long. In most cases, diarrhea typically lasts 2-3 days. However, it can last longer if it is a sign of something more serious. It is important to treat your diarrhea as directed by your caregiver to lessen or prevent future episodes of diarrhea. CAUSES  Some common causes include:  Gastrointestinal infections caused by viruses, bacteria, or parasites.  Food poisoning or food allergies.  Certain medicines, such as antibiotics, chemotherapy, and laxatives.  Artificial sweeteners and fructose.  Digestive disorders. HOME CARE INSTRUCTIONS  Ensure adequate fluid intake (hydration): Have 1 cup (8 oz) of fluid for each diarrhea episode. Avoid fluids that contain simple sugars or sports drinks, fruit juices, whole milk products, and sodas. Your urine should be clear or pale yellow if you are drinking enough fluids. Hydrate with an oral rehydration solution that you can purchase at pharmacies, retail stores, and online. You can prepare an oral rehydration solution at home by mixing the following ingredients together:   - tsp table salt.   tsp baking soda.   tsp salt substitute containing potassium chloride.  1  tablespoons sugar.  1 L (34 oz) of water.  Certain foods and beverages may increase the speed at which food moves through the gastrointestinal (GI) tract.  These foods and beverages should be avoided and include:  Caffeinated and alcoholic beverages.  High-fiber foods, such as raw fruits and vegetables, nuts, seeds, and whole grain breads and cereals.  Foods and beverages sweetened with sugar alcohols, such as xylitol, sorbitol, and mannitol.  Some foods may be well tolerated and may help thicken stool including:  Starchy foods, such as rice, toast, pasta, low-sugar cereal, oatmeal, grits, baked potatoes, crackers, and bagels.  Bananas.  Applesauce.  Add probiotic-rich foods to help increase healthy bacteria in the GI tract, such as yogurt and fermented milk products.  Wash your hands well after each diarrhea episode.  Only take over-the-counter or prescription medicines as directed by your caregiver.  Take a warm bath to relieve any burning or pain from frequent diarrhea episodes. SEEK IMMEDIATE MEDICAL CARE IF:   You are unable to keep fluids down.  You have persistent vomiting.  You have blood in your stool, or your stools are black and tarry.  You do not urinate in 6-8 hours, or there is only a small amount of very dark urine.  You have abdominal pain that increases or localizes.  You have weakness, dizziness, confusion, or light-headedness.  You have a severe headache.  Your diarrhea gets worse or does not get better.  You have a fever or persistent symptoms for more than 2-3 days.  You have a fever and your symptoms suddenly get worse. MAKE SURE YOU:   Understand these instructions.  Will watch your condition.  Will get help right away if you are not doing well or get worse.   This information is not  intended to replace advice given to you by your health care provider. Make sure you discuss any questions you have with your health care provider.   Document Released: 09/17/2002 Document Revised: 10/18/2014 Document Reviewed: 06/04/2012 Elsevier Interactive Patient Education 2016 [employer]  Nausea and  Vomiting Nausea is a sick feeling that often comes before throwing up (vomiting). Vomiting is a reflex where stomach contents come out of your mouth. Vomiting can cause severe loss of body fluids (dehydration). Children and elderly adults can become dehydrated quickly, especially if they also have diarrhea. Nausea and vomiting are symptoms of a condition or disease. It is important to find the cause of your symptoms. CAUSES   Direct irritation of the stomach lining. This irritation can result from increased acid production (gastroesophageal reflux disease), infection, food poisoning, taking certain medicines (such as nonsteroidal anti-inflammatory drugs), alcohol use, or tobacco use.  Signals from the brain.These signals could be caused by a headache, heat exposure, an inner ear disturbance, increased pressure in the brain from injury, infection, a tumor, or a concussion, pain, emotional stimulus, or metabolic problems.  An obstruction in the gastrointestinal tract (bowel obstruction).  Illnesses such as diabetes, hepatitis, gallbladder problems, appendicitis, kidney problems, cancer, sepsis, atypical symptoms of a heart attack, or eating disorders.  Medical treatments such as chemotherapy and radiation.  Receiving medicine that makes you sleep (general anesthetic) during surgery. DIAGNOSIS Your caregiver may ask for tests to be done if the problems do not improve after a few days. Tests may also be done if symptoms are severe or if the reason for the nausea and vomiting is not clear. Tests may include:  Urine tests.  Blood tests.  Stool tests.  Cultures (to look for evidence of infection).  X-rays or other imaging studies. Test results can help your caregiver make decisions about treatment or the need for additional tests. TREATMENT You need to stay well hydrated. Drink frequently but in small amounts.You may wish to drink water, sports drinks, clear broth, or eat frozen ice pops or  gelatin dessert to help stay hydrated.When you eat, eating slowly may help prevent nausea.There are also some antinausea medicines that may help prevent nausea. HOME CARE INSTRUCTIONS   Take all medicine as directed by your caregiver.  If you do not have an appetite, do not force yourself to eat. However, you must continue to drink fluids.  If you have an appetite, eat a normal diet unless your caregiver tells you differently.  Eat a variety of complex carbohydrates (rice, wheat, potatoes, bread), lean meats, yogurt, fruits, and vegetables.  Avoid high-fat foods because they are more difficult to digest.  Drink enough water and fluids to keep your urine clear or pale yellow.  If you are dehydrated, ask your caregiver for specific rehydration instructions. Signs of dehydration may include:  Severe thirst.  Dry lips and mouth.  Dizziness.  Dark urine.  Decreasing urine frequency and amount.  Confusion.  Rapid breathing or pulse. SEEK IMMEDIATE MEDICAL CARE IF:   You have blood or brown flecks (like coffee grounds) in your vomit.  You have black or bloody stools.  You have a severe headache or stiff neck.  You are confused.  You have severe abdominal pain.  You have chest pain or trouble breathing.  You do not urinate at least once every 8 hours.  You develop cold or clammy skin.  You continue to vomit for longer than 24 to 48 hours.  You have a fever. MAKE SURE YOU:  Understand these instructions.  Will watch your condition.  Will get help right away if you are not doing well or get worse.   This information is not intended to replace advice given to you by your health care provider. Make sure you discuss any questions you have with your health care provider.   Document Released: 09/27/2005 Document Revised: 12/20/2011 Document Reviewed: 02/24/2011 Elsevier Interactive Patient Education [employer].

## 2015-07-19 NOTE — ED Notes (Signed)
Pt denies nausea at this time.

## 2015-07-19 NOTE — ED Provider Notes (Signed)
CSN: [id number]     Arrival date & time 07/19/15  1815 History   First MD Initiated Contact with Patient 07/19/15 1853     Chief Complaint  Patient presents with  . Nausea  . Abdominal Pain     (Consider location/radiation/quality/duration/timing/severity/associated sxs/prior Treatment) HPI [name] is a 24 y.o. female with a history of hypertension, obesity, diabetes, comes in for evaluation of nausea, vomiting, diarrhea and abdominal discomfort. Patient states that yesterday at approximately 10:00 AM she has had nausea, vomiting, diarrhea and abdominal discomfort. She reports having taken Pepto-Bismol for her symptoms which helped, but then wore off. She denies any bloody or bilious emesis, no bloody stools. She reports sharp stabbing abdominal pain that is located diffusely throughout her abdomen. Rates her discomfort as 8.5/10. Reports subjective fevers at home. No chills, chest pain or shortness of breath, no dysuria, hematuria, vaginal bleeding or discharge, no pelvic pain, no back pain. No other aggravating or modifying factors.  Past Medical History  Diagnosis Date  . Hypertension     age 3  . Morbid obesity (HCC)   . Migraine   . Ovarian cyst   . Diabetes mellitus without complication [facility])    Past Surgical History  Procedure Laterality Date  . Cholecystectomy    . Tubes removed     Family History  Problem Relation Age of Onset  . Hypertension Mother    Social History  Substance Use Topics  . Smoking status: Current Some Day Smoker -- 3.00 packs/day    Types: Cigarettes  . Smokeless tobacco: Never Used  . Alcohol Use: Yes     Comment: occassionally   OB History    No data available     Review of Systems A 10 point review of systems was completed and was negative except for pertinent positives and negatives as mentioned in the history of present illness     Allergies  Review of patient's allergies indicates no known allergies.  Home Medications    Prior to Admission medications   Medication Sig Start Date End Date Taking? Authorizing Provider  cephALEXin (KEFLEX) 500 MG capsule Take 1 capsule (500 mg total) by mouth 3 (three) times daily. 06/27/15   [name], MD  loperamide (IMODIUM) 2 MG capsule Take 1 capsule (2 mg total) by mouth 4 (four) times daily as needed for diarrhea or loose stools. 07/19/15   Joycie Peek, PA-C  metFORMIN (GLUCOPHAGE) 500 MG tablet Take 1 tablet (500 mg total) by mouth 2 (two) times daily with a meal. 04/29/15   Jerelyn Scott, MD  norethindrone (MICRONOR,CAMILA,ERRIN) 0.35 MG tablet Take 1 tablet by mouth daily.    Historical Provider, MD  ondansetron (ZOFRAN ODT) 4 MG disintegrating tablet 4mg  ODT q4 hours prn nausea/vomit 07/19/15   Thad Osoria, PA-C   BP 113/58 mmHg  Pulse 105  Temp(Src) 98.6 F (37 C) (Oral)  Resp 18  Ht 5\' 8"  (1.727 m)  Wt 251 lb (113.853 kg)  BMI 38.17 kg/m2  SpO2 99%  LMP 06/20/2015 (Approximate) Physical Exam  Constitutional: She is oriented to person, place, and time. She appears well-developed and well-nourished.  Obese Caucasian female  HENT:  Head: Normocephalic and atraumatic.  Mouth/Throat: Oropharynx is clear and moist.  Eyes: Conjunctivae are normal. Pupils are equal, round, and reactive to light. Right eye exhibits no discharge. Left eye exhibits no discharge. No scleral icterus.  Neck: Neck supple.  Cardiovascular: Regular rhythm and normal heart sounds.   Slight tachycardia with normal heart sounds  and regular rhythm.  Pulmonary/Chest: Effort normal and breath sounds normal. No respiratory distress. She has no wheezes. She has no rales.  Abdominal: Soft.  Diffuse abdominal tenderness without any focal tenderness. Negative McBurney's. Abdomen otherwise soft and nondistended, however there is a large panniculus. No rebound or guarding. No other lesions or deformities.  Musculoskeletal: She exhibits no tenderness.  Neurological: She is alert and oriented  to person, place, and time.  Cranial Nerves II-XII grossly intact  Skin: Skin is warm and dry. No rash noted.  Psychiatric: She has a normal mood and affect.  Nursing note and vitals reviewed.   ED Course  Procedures (including critical care time) Labs Review Labs Reviewed  URINALYSIS, ROUTINE W REFLEX MICROSCOPIC (NOT AT [facility]) - Abnormal; Notable for the following:    Color, Urine AMBER (*)    APPearance CLOUDY (*)    Specific Gravity, Urine 1.031 (*)    Bilirubin Urine SMALL (*)    Leukocytes, UA SMALL (*)    All other components within normal limits  URINE MICROSCOPIC-ADD ON - Abnormal; Notable for the following:    Squamous Epithelial / LPF MANY (*)    Bacteria, UA FEW (*)    Casts HYALINE CASTS (*)    All other components within normal limits  COMPREHENSIVE METABOLIC PANEL - Abnormal; Notable for the following:    Sodium 132 (*)    Potassium 3.4 (*)    CO2 21 (*)    Glucose, Bld 186 (*)    Calcium 8.6 (*)    Total Protein 8.3 (*)    All other components within normal limits  CBC WITH DIFFERENTIAL/PLATELET - Abnormal; Notable for the following:    WBC 12.1 (*)    Neutro Abs 9.5 (*)    All other components within normal limits  PREGNANCY, URINE    Imaging Review No results found. I have personally reviewed and evaluated these images and lab results as part of my medical decision-making.   EKG Interpretation None     Meds given in ED:  Medications  sodium chloride 0.9 % bolus 1,000 mL (0 mLs Intravenous Stopped 07/19/15 1940)  acetaminophen (TYLENOL) tablet 650 mg (650 mg Oral Given 07/19/15 1938)  sodium chloride 0.9 % bolus 1,000 mL (0 mLs Intravenous Stopped 07/19/15 2012)    New Prescriptions   LOPERAMIDE (IMODIUM) 2 MG CAPSULE    Take 1 capsule (2 mg total) by mouth 4 (four) times daily as needed for diarrhea or loose stools.   ONDANSETRON (ZOFRAN ODT) 4 MG DISINTEGRATING TABLET     ODT q4 hours prn nausea/vomit   Filed Vitals:   07/19/15 1827  07/19/15 1931 07/19/15 2012 07/19/15 2034  BP: 109/66 113/67  113/58  Pulse: 128 110  105  Temp: 99.7 F (37.6 C) 100.9 F (38.3 C) 98.6 F (37 C)   TempSrc: Oral Oral Oral   Resp: Height:  (1.727 m)     Weight: 251 lb (113.853 kg)     SpO2: 96% 99%  99%    MDM  [name] is a 24 y.o. female who presents for evaluation of diffuse abdominal pain with nausea, vomiting and diarrhea. Symptoms appear to be mostly consistent with a viral process. No focal tenderness on abdominal exam. Patient original tachycardia is improving with IV fluids. Fever also improved with Tylenol. She is otherwise hemodynamically stable. Labs are significant for leukocytosis of 12.1, which appears to be baseline for patient. No evidence of UTI on  urinalysis. Specific gravity is 1.031, suggesting dehydration.  Patient is tolerating oral fluids and food in the ED without difficulty. States that she wants to go home. Without any focal abdominal tenderness or other concerning objective findings, I feel patient is stable to be discharged home. Thoroughly discussed return precautions and that her symptoms could mimic an early appendicitis. The patient and boyfriend at bedside verbalized understanding and voice they will return if symptoms worsen. Low suspicion for other acute or emergent intra-abdominal pathology. Discharge with oral medications for Zofran and loperamide. I discussed all relevant lab findings and imaging results with pt and they verbalized understanding. Discussed f/u with PCP within 48 hrs and return precautions, pt very amenable to plan. Prior to patient discharge, I discussed and reviewed this case with Dr.James    Final diagnoses:  Non-intractable vomiting with nausea, vomiting of unspecified type  Diarrhea, unspecified type       Joycie Peek, PA-C 07/19/15 2043  Joycie Peek, PA-C 07/19/15 9147  Rolland Porter, MD 07/19/15 2242

## 2015-11-17 ENCOUNTER — Emergency Department (HOSPITAL_BASED_OUTPATIENT_CLINIC_OR_DEPARTMENT_OTHER)
Admission: EM | Admit: 2015-11-17 | Discharge: 2015-11-17 | Disposition: A | Discharge status: Home / Self Care | Payer: Medicaid Other | Attending: Emergency Medicine | Admitting: Emergency Medicine

## 2015-11-17 ENCOUNTER — Encounter (HOSPITAL_BASED_OUTPATIENT_CLINIC_OR_DEPARTMENT_OTHER): Payer: Self-pay

## 2015-11-17 DIAGNOSIS — R1084 Generalized abdominal pain: Secondary | ICD-10-CM | POA: Insufficient documentation

## 2015-11-17 DIAGNOSIS — Z9049 Acquired absence of other specified parts of digestive tract: Secondary | ICD-10-CM | POA: Insufficient documentation

## 2015-11-17 DIAGNOSIS — E119 Type 2 diabetes mellitus without complications: Secondary | ICD-10-CM | POA: Insufficient documentation

## 2015-11-17 DIAGNOSIS — R109 Unspecified abdominal pain: Secondary | ICD-10-CM

## 2015-11-17 DIAGNOSIS — Z3202 Encounter for pregnancy test, result negative: Secondary | ICD-10-CM | POA: Insufficient documentation

## 2015-11-17 DIAGNOSIS — Z793 Long term (current) use of hormonal contraceptives: Secondary | ICD-10-CM | POA: Insufficient documentation

## 2015-11-17 DIAGNOSIS — E669 Obesity, unspecified: Secondary | ICD-10-CM | POA: Insufficient documentation

## 2015-11-17 DIAGNOSIS — I10 Essential (primary) hypertension: Secondary | ICD-10-CM | POA: Insufficient documentation

## 2015-11-17 DIAGNOSIS — Z8679 Personal history of other diseases of the circulatory system: Secondary | ICD-10-CM | POA: Insufficient documentation

## 2015-11-17 DIAGNOSIS — Z7984 Long term (current) use of oral hypoglycemic drugs: Secondary | ICD-10-CM | POA: Insufficient documentation

## 2015-11-17 DIAGNOSIS — Z8742 Personal history of other diseases of the female genital tract: Secondary | ICD-10-CM | POA: Insufficient documentation

## 2015-11-17 DIAGNOSIS — F17210 Nicotine dependence, cigarettes, uncomplicated: Secondary | ICD-10-CM | POA: Insufficient documentation

## 2015-11-17 LAB — URINE MICROSCOPIC-ADD ON

## 2015-11-17 LAB — COMPREHENSIVE METABOLIC PANEL
ALBUMIN: 3.8 g/dL (ref 3.5–5.0)
ALK PHOS: 60 U/L (ref 38–126)
ALT: 45 U/L (ref 14–54)
AST: 36 U/L (ref 15–41)
Anion gap: 11 (ref 5–15)
BUN: 11 mg/dL (ref 6–20)
CALCIUM: 8.7 mg/dL — AB (ref 8.9–10.3)
CO2: 25 mmol/L (ref 22–32)
CREATININE: 0.7 mg/dL (ref 0.44–1.00)
Chloride: 101 mmol/L (ref 101–111)
GFR calc Af Amer: >60 mL/min (ref >60)
GFR calc non Af Amer: >60 mL/min (ref >60)
GLUCOSE: 247 mg/dL — AB (ref 65–99)
Potassium: 4.2 mmol/L (ref 3.5–5.1)
SODIUM: 137 mmol/L (ref 135–145)
Total Bilirubin: 1 mg/dL (ref 0.3–1.2)
Total Protein: 7.6 g/dL (ref 6.5–8.1)

## 2015-11-17 LAB — URINALYSIS, ROUTINE W REFLEX MICROSCOPIC
BILIRUBIN URINE: NEGATIVE
Glucose, UA: 100 mg/dL — AB
Ketones, ur: 15 mg/dL — AB
Nitrite: NEGATIVE
PROTEIN: NEGATIVE mg/dL
Specific Gravity, Urine: 1.029 (ref 1.005–1.030)
pH: 5.5 (ref 5.0–8.0)

## 2015-11-17 LAB — CBC WITH DIFFERENTIAL/PLATELET
BASOS PCT: 0 %
Basophils Absolute: 0 10*3/uL (ref 0.0–0.1)
EOS ABS: 0.2 10*3/uL (ref 0.0–0.7)
Eosinophils Relative: 2 %
HCT: 40.4 % (ref 36.0–46.0)
Hemoglobin: 13.6 g/dL (ref 12.0–15.0)
Lymphocytes Relative: 11 %
Lymphs Abs: 1 10*3/uL (ref 0.7–4.0)
MCH: 28.5 pg (ref 26.0–34.0)
MCHC: 33.7 g/dL (ref 30.0–36.0)
MCV: 84.7 fL (ref 78.0–100.0)
MONO ABS: 0.5 10*3/uL (ref 0.1–1.0)
MONOS PCT: 6 %
Neutro Abs: 7.5 10*3/uL (ref 1.7–7.7)
Neutrophils Relative %: 81 %
Platelets: 184 10*3/uL (ref 150–400)
RBC: 4.77 MIL/uL (ref 3.87–5.11)
RDW: 12.7 % (ref 11.5–15.5)
WBC: 9.2 10*3/uL (ref 4.0–10.5)

## 2015-11-17 LAB — LIPASE, BLOOD: LIPASE: 29 U/L (ref 11–51)

## 2015-11-17 LAB — PREGNANCY, URINE: PREG TEST UR: NEGATIVE

## 2015-11-17 MED ORDER — SODIUM CHLORIDE 0.9 % IV BOLUS (SEPSIS)
1000.0000 mL | Freq: Once | INTRAVENOUS | Status: AC
  Administered 2015-11-17: 1000 mL via INTRAVENOUS

## 2015-11-17 MED ORDER — NITROFURANTOIN MONOHYD MACRO 100 MG PO CAPS: 100.0000 mg | ORAL_CAPSULE | Freq: Two times a day (BID) | ORAL | Status: DC

## 2015-11-17 MED ORDER — MORPHINE SULFATE (PF) 4 MG/ML IV SOLN
4.0000 mg | Freq: Once | INTRAVENOUS | Status: AC
  Administered 2015-11-17: 4 mg via INTRAVENOUS
  Filled 2015-11-17: qty 1

## 2015-11-17 MED ORDER — FENTANYL CITRATE (PF) 100 MCG/2ML IJ SOLN
100.0000 ug | Freq: Once | INTRAMUSCULAR | Status: AC
  Administered 2015-11-17: 100 ug via INTRAVENOUS
  Filled 2015-11-17: qty 2

## 2015-11-17 NOTE — Discharge Instructions (Signed)
There does not appear to be an emergent cause for your symptoms at this time. Your symptoms are possibly due to a urinary tract infection. Please take your antibiotics as prescribed to help treat this infection. Please follow-up with your doctor or use the attachment resource guide to help find a primary care doctor and follow-up this week for reevaluation. Return to ED for any new or worsening symptoms as we discussed.  Abdominal Pain, Adult Many things can cause abdominal pain. Usually, abdominal pain is not caused by a disease and will improve without treatment. It can often be observed and treated at home. Your health care provider will do a physical exam and possibly order blood tests and X-rays to help determine the seriousness of your pain. However, in many cases, more time must pass before a clear cause of the pain can be found. Before that point, your health care provider may not know if you need more testing or further treatment. HOME CARE INSTRUCTIONS Monitor your abdominal pain for any changes. The following actions may help to alleviate any discomfort you are experiencing:  Only take over-the-counter or prescription medicines as directed by your health care provider.  Do not take laxatives unless directed to do so by your health care provider.  Try a clear liquid diet (broth, tea, or water) as directed by your health care provider. Slowly move to a bland diet as tolerated. SEEK MEDICAL CARE IF:  You have unexplained abdominal pain.  You have abdominal pain associated with nausea or diarrhea.  You have pain when you urinate or have a bowel movement.  You experience abdominal pain that wakes you in the night.  You have abdominal pain that is worsened or improved by eating food.  You have abdominal pain that is worsened with eating fatty foods.  You have a fever. SEEK IMMEDIATE MEDICAL CARE IF:  Your pain does not go away within 2 hours.  You keep throwing up  (vomiting).  Your pain is felt only in portions of the abdomen, such as the right side or the left lower portion of the abdomen.  You pass bloody or black tarry stools. MAKE SURE YOU:  Understand these instructions.  Will watch your condition.  Will get help right away if you are not doing well or get worse.   This information is not intended to replace advice given to you by your health care provider. Make sure you discuss any questions you have with your health care provider.   Document Released: 07/07/2005 Document Revised: 06/18/2015 Document Reviewed: 06/06/2013 Elsevier Interactive Patient Education [employer]

## 2015-11-17 NOTE — ED Notes (Signed)
abd pain x today-denies n/v/d, vaginal d/c and urinary s/s

## 2015-11-17 NOTE — ED Provider Notes (Signed)
CSN: [id number]     Arrival date & time 11/17/15  1753 History   First MD Initiated Contact with Patient 11/17/15 1914     Chief Complaint  Patient presents with  . Abdominal Pain     (Consider location/radiation/quality/duration/timing/severity/associated sxs/prior Treatment) HPI Jennyfer Bonsall is a 25 y.o. female with a history of hypertension, obesity, diabetes, comes in for evaluation of abdominal pain. Patient reports she has had ongoing, constant, persistent abdominal pain since this morning at approximately 7:00 AM when she woke up. She characterizes the discomfort as "somebody punched me in the stomach". She took Motrin without relief of her symptoms. She denies any fevers, chills, nausea or vomiting, urinary symptoms, back pain, diarrhea or constipation, unusual vaginal bleeding or discharge. Nothing makes the problem better or worse. No other modifying factors.  Past Medical History  Diagnosis Date  . Hypertension     age 37  . Morbid obesity (HCC)   . Migraine   . Ovarian cyst   . Diabetes mellitus without complication [facility])    Past Surgical History  Procedure Laterality Date  . Cholecystectomy    . Tubes removed     Family History  Problem Relation Age of Onset  . Hypertension Mother    Social History  Substance Use Topics  . Smoking status: Current Some Day Smoker -- 3.00 packs/day    Types: Cigarettes  . Smokeless tobacco: Never Used  . Alcohol Use: Yes     Comment: occassionally   OB History    No data available     Review of Systems A 10 point review of systems was completed and was negative except for pertinent positives and negatives as mentioned in the history of present illness     Allergies  Review of patient's allergies indicates no known allergies.  Home Medications   Prior to Admission medications   Medication Sig Start Date End Date Taking? Authorizing Provider  metFORMIN (GLUCOPHAGE) 500 MG tablet Take 1 tablet (500 mg total) by  mouth 2 (two) times daily with a meal. 04/29/15   Jerelyn Scott, MD  nitrofurantoin, macrocrystal-monohydrate, (MACROBID) 100 MG capsule Take 1 capsule (100 mg total) by mouth 2 (two) times daily. 11/17/15   Joycie Peek, PA-C  norethindrone (MICRONOR,CAMILA,ERRIN) 0.35 MG tablet Take 1 tablet by mouth daily.    Historical Provider, MD   BP 132/78 mmHg  Pulse 98  Temp(Src) 98.4 F (36.9 C) (Oral)  Resp 18  Ht  (1.727 m)  Wt 117.482 kg  BMI 39.39 kg/m2  SpO2 98% Physical Exam  Constitutional: She is oriented to person, place, and time. She appears well-developed and well-nourished.  Obese Caucasian female  HENT:  Head: Normocephalic and atraumatic.  Mouth/Throat: Oropharynx is clear and moist.  Eyes: Conjunctivae are normal. Pupils are equal, round, and reactive to light. Right eye exhibits no discharge. Left eye exhibits no discharge. No scleral icterus.  Neck: Neck supple.  Cardiovascular: Normal rate, regular rhythm and normal heart sounds.   Pulmonary/Chest: Effort normal and breath sounds normal. No respiratory distress. She has no wheezes. She has no rales.  Abdominal: Soft.  Diffuse abdominal tenderness. Abdomen is otherwise soft, nondistended without rebound or guarding.  Musculoskeletal: She exhibits no tenderness.  Neurological: She is alert and oriented to person, place, and time.  Cranial Nerves II-XII grossly intact  Skin: Skin is warm and dry. No rash noted.  Psychiatric: She has a normal mood and affect.  Nursing note and vitals reviewed.   ED Course  Procedures (including critical care time) Labs Review Labs Reviewed  URINALYSIS, ROUTINE W REFLEX MICROSCOPIC (NOT AT [facility]) - Abnormal; Notable for the following:    Color, Urine AMBER (*)    APPearance CLOUDY (*)    Glucose, UA 100 (*)    Hgb urine dipstick LARGE (*)    Ketones, ur 15 (*)    Leukocytes, UA MODERATE (*)    All other components within normal limits  URINE MICROSCOPIC-ADD ON - Abnormal;  Notable for the following:    Squamous Epithelial / LPF 6-30 (*)    Bacteria, UA FEW (*)    Casts GRANULAR CAST (*)    All other components within normal limits  COMPREHENSIVE METABOLIC PANEL - Abnormal; Notable for the following:    Glucose, Bld 247 (*)    Calcium 8.7 (*)    All other components within normal limits  PREGNANCY, URINE  CBC WITH DIFFERENTIAL/PLATELET  LIPASE, BLOOD    Imaging Review No results found. I have personally reviewed and evaluated these images and lab results as part of my medical decision-making.   EKG Interpretation None     Meds given in ED:  Medications  sodium chloride 0.9 % bolus 1,000 mL (0 mLs Intravenous Stopped 11/17/15 2030)  fentaNYL (SUBLIMAZE) injection 100 mcg (100 mcg Intravenous Given 11/17/15 1934)  morphine 4 MG/ML injection 4 mg (4 mg Intravenous Given 11/17/15 2037)    Discharge Medication List as of 11/17/2015  8:44 PM    START taking these medications   Details  nitrofurantoin, macrocrystal-monohydrate, (MACROBID) 100 MG capsule Take 1 capsule (100 mg total) by mouth 2 (two) times daily., Starting 11/17/2015, Until Discontinued, Print       Filed Vitals:   11/17/15 1802 11/17/15 2021  BP: 126/84 132/78  Pulse: 116 98  Temp: 98.4 F (36.9 C)   TempSrc: Oral   Resp: 20 18  Height:  (1.727 m)   Weight: 117.482 kg   SpO2: 100% 98%    MDM  .Shonda Kotula is a 25 y.o. female who comes in for evaluation of diffuse abdominal pain starting earlier this morning. On arrival, she is slightly tachycardic, otherwise hemodynamically stable and afebrile. Tachycardia resolves after pain medicine in the ED. She has diffuse abdominal discomfort on exam with no focal tenderness. Basic labs are unremarkable. Urine with some suggestion of UTI. Plan to DC with antibiotics. Low suspicion for acute appendicitis. Discussed strict return precautions including fever, vomiting, worsening pain. Patient verbalizes understanding and agrees with  this plan. No evidence of other acute emergent pathology at this time. Final diagnoses:  Abdominal discomfort       Joycie Peek, PA-C 11/18/15 1453  Nelva Nay, MD 11/19/15 [phone]

## 2016-05-04 ENCOUNTER — Emergency Department (HOSPITAL_BASED_OUTPATIENT_CLINIC_OR_DEPARTMENT_OTHER)
Admission: EM | Admit: 2016-05-04 | Discharge: 2016-05-04 | Disposition: A | Discharge status: Home / Self Care | Payer: Medicaid Other | Attending: Emergency Medicine | Admitting: Emergency Medicine

## 2016-05-04 ENCOUNTER — Encounter (HOSPITAL_BASED_OUTPATIENT_CLINIC_OR_DEPARTMENT_OTHER): Payer: Self-pay

## 2016-05-04 DIAGNOSIS — L0291 Cutaneous abscess, unspecified: Secondary | ICD-10-CM

## 2016-05-04 DIAGNOSIS — F17210 Nicotine dependence, cigarettes, uncomplicated: Secondary | ICD-10-CM | POA: Insufficient documentation

## 2016-05-04 DIAGNOSIS — E119 Type 2 diabetes mellitus without complications: Secondary | ICD-10-CM | POA: Insufficient documentation

## 2016-05-04 DIAGNOSIS — I10 Essential (primary) hypertension: Secondary | ICD-10-CM | POA: Insufficient documentation

## 2016-05-04 DIAGNOSIS — Z6838 Body mass index (BMI) 38.0-38.9, adult: Secondary | ICD-10-CM | POA: Insufficient documentation

## 2016-05-04 DIAGNOSIS — L02211 Cutaneous abscess of abdominal wall: Secondary | ICD-10-CM | POA: Insufficient documentation

## 2016-05-04 DIAGNOSIS — Z7984 Long term (current) use of oral hypoglycemic drugs: Secondary | ICD-10-CM | POA: Insufficient documentation

## 2016-05-04 MED ORDER — LIDOCAINE HCL 2 % IJ SOLN
10.0000 mL | Freq: Once | INTRAMUSCULAR | Status: AC
  Administered 2016-05-04: 200 mg via INTRADERMAL
  Filled 2016-05-04: qty 20

## 2016-05-04 MED ORDER — HYDROCODONE-ACETAMINOPHEN 5-325 MG PO TABS
1.0000 | ORAL_TABLET | Freq: Once | ORAL | Status: AC
  Administered 2016-05-04: 1 via ORAL
  Filled 2016-05-04: qty 1

## 2016-05-04 NOTE — ED Triage Notes (Signed)
C/o ? Abscess to abd x 2 weeks-NAD-steady gait

## 2016-05-04 NOTE — ED Provider Notes (Signed)
[facility] Provider Note   CSN: [id number] Arrival date & time: 05/04/16  1925  First Provider Contact:  First MD Initiated Contact with Patient 05/04/16 2000      By signing my name below, I, [facility], attest that this documentation has been prepared under the direction and in the presence of [name], PA-C. Electronically Signed: [name], ED Scribe. 05/04/16. 8:58 PM.   History   Chief Complaint Chief Complaint  Patient presents with  . Abscess    HPI [name] is a 25 y.o. female who presents to the Emergency Department complaining of a constant, moderately painful, gradually increasing in redness and swelling abscess to her lower abdomen onset 2 weeks ago. Pt states that she noticed a small, erythematous bump on her lower central abdomen 2 weeks ago that has gradually increased in size and pain. She reports nausea, diarrhea, and vomiting 2 days ago that have all since resolved. She has applied warm compresses without relief and her friend notes that he attempted to open, drain, and squeeze the affected area but no pus or fluid was produced. Denies fevers, chills or any other symptoms currently.  The history is provided by the patient and a friend. No language interpreter was used.    Past Medical History:  Diagnosis Date  . Diabetes mellitus without complication (HCC)   . Hypertension    age 71  . Migraine   . Morbid obesity (HCC)   . Ovarian cyst     Patient Active Problem List   Diagnosis Date Noted  . SIRS (systemic inflammatory response syndrome) (HCC) 04/06/2015  . Hyperglycemia 04/06/2015  . Nausea and vomiting 04/06/2015  . Migraine 04/06/2015  . Essential hypertension 04/06/2015  . Syncope 04/06/2015    Past Surgical History:  Procedure Laterality Date  . CHOLECYSTECTOMY    . TUBES REMOVED      OB History    No data available       Home Medications    Prior to Admission medications   Medication  Sig Start Date End Date Taking? Authorizing Provider  metFORMIN (GLUCOPHAGE) 500 MG tablet Take 1 tablet (500 mg total) by mouth 2 (two) times daily with a meal. 04/29/15   Jerelyn Scott, MD    Family History Family History  Problem Relation Age of Onset  . Hypertension Mother     Social History Social History  Substance Use Topics  . Smoking status: Current Some Day Smoker    Packs/day: 3.00    Types: Cigarettes  . Smokeless tobacco: Never Used  . Alcohol use Yes     Comment: occassionally     Allergies   Review of patient's allergies indicates no known allergies.   Review of Systems Review of Systems  Gastrointestinal: Negative for diarrhea (resolved), nausea (resolved) and vomiting (resolved).  Skin: Positive for color change.  All other systems reviewed and are negative.    Physical Exam Updated Vital Signs BP 154/96 (BP Location: Right Arm)   Pulse 91   Temp 98.5 F (36.9 C) (Oral)   Resp 20   Ht  (1.727 m)   Wt 113.4 kg   SpO2 99%   BMI 38.01 kg/m   Physical Exam  Constitutional: She is oriented to person, place, and time. She appears well-developed and well-nourished. No distress.  HENT:  Head: Normocephalic and atraumatic.  Eyes: Conjunctivae are normal.  Neck: Normal range of motion.  Cardiovascular: Normal rate.   Pulmonary/Chest: Effort normal. No respiratory distress.  Musculoskeletal: Normal range of motion.  Neurological: She is alert and oriented to person, place, and time.  Skin: Skin is warm and dry.     3 cm fluctuance just inferior to umbilicus. Surrounding induration of approximately 3 cm. No overlying erythema or streaking. No active drainage.   Psychiatric: She has a normal mood and affect. Her behavior is normal.  Nursing note and vitals reviewed.    ED Treatments / Results   COORDINATION OF CARE: 8:02 PM Will consult with attending MD Dr. Rolland Porter. Discussed treatment plan with pt at bedside and pt agreed to  plan.  8:52 PM Consulted with attending MD Dr. Fayrene Fearing who examined pt and advised performing I&D in ED tonight. Will return to perform I&D of abscess.  8:57 PM Returned to perform I&D of abscess.  Procedures .Marland KitchenIncision and Drainage Date/Time: 05/04/2016 9:09 PM Performed by: Alveta Heimlich Authorized by: Alveta Heimlich   Consent:    Consent obtained:  Verbal   Consent given by:  Patient   Risks discussed:  Pain Location:    Type:  Abscess   Size:  3 cm   Location:  Trunk   Trunk location:  Abdomen Pre-procedure details:    Skin preparation:  Betadine Anesthesia (see MAR for exact dosages):    Anesthesia method:  Local infiltration   Local anesthetic:  Lidocaine 2% w/o epi Procedure type:    Complexity:  Simple Procedure details:    Needle aspiration: no     Incision types:  Single straight   Scalpel blade:  11   Wound management:  Probed and deloculated   Drainage:  Serosanguinous   Drainage amount:  Moderate   Wound treatment:  Wound left open   Packing materials:  None Post-procedure details:    Patient tolerance of procedure:  Tolerated well, no immediate complications     Medications Ordered in ED Medications  HYDROcodone-acetaminophen (NORCO/VICODIN) 5-325 MG per tablet 1 tablet (1 tablet Oral Given 05/04/16 2038)  lidocaine (XYLOCAINE) 2 % (with pres) injection 200 mg (200 mg Intradermal Given 05/04/16 2054)     Initial Impression / Assessment and Plan / ED Course  I have reviewed the triage vital signs and the nursing notes.  Pertinent labs & imaging results that were available during my care of the patient were reviewed by me and considered in my medical decision making (see chart for details).  Clinical Course   Patient presenting with skin abscess of the abdomen amenable to incision and drainage.  Patient tolerated the procedure well. No packing or drain inserted. Antibiotic therapy is not indicated. Encouraged warm soaks at home and keeping the wound  clean and dry. Instructed to go to PCP or urgent care in 2 days for wound recheck. Patient expresses understanding and is stable for discharge.    Final Clinical Impressions(s) / ED Diagnoses   Final diagnoses:  Abscess    New Prescriptions Discharge Medication List as of 05/04/2016  9:11 PM     I personally performed the services described in this documentation, which was scribed in my presence. The recorded information has been reviewed and is accurate.     Rolm Gala Shaianne Nucci, PA-C 05/05/16 1222    Rolland Porter, MD 05/18/16 1016

## 2016-08-05 ENCOUNTER — Emergency Department (HOSPITAL_BASED_OUTPATIENT_CLINIC_OR_DEPARTMENT_OTHER)
Admission: EM | Admit: 2016-08-05 | Discharge: 2016-08-05 | Disposition: A | Discharge status: Home / Self Care | Payer: Self-pay | Attending: Emergency Medicine | Admitting: Emergency Medicine

## 2016-08-05 ENCOUNTER — Emergency Department (HOSPITAL_BASED_OUTPATIENT_CLINIC_OR_DEPARTMENT_OTHER): Payer: Self-pay

## 2016-08-05 ENCOUNTER — Encounter (HOSPITAL_BASED_OUTPATIENT_CLINIC_OR_DEPARTMENT_OTHER): Payer: Self-pay | Admitting: Emergency Medicine

## 2016-08-05 DIAGNOSIS — E119 Type 2 diabetes mellitus without complications: Secondary | ICD-10-CM | POA: Insufficient documentation

## 2016-08-05 DIAGNOSIS — M25572 Pain in left ankle and joints of left foot: Secondary | ICD-10-CM | POA: Insufficient documentation

## 2016-08-05 DIAGNOSIS — F17210 Nicotine dependence, cigarettes, uncomplicated: Secondary | ICD-10-CM | POA: Insufficient documentation

## 2016-08-05 DIAGNOSIS — I10 Essential (primary) hypertension: Secondary | ICD-10-CM | POA: Insufficient documentation

## 2016-08-05 DIAGNOSIS — Z7984 Long term (current) use of oral hypoglycemic drugs: Secondary | ICD-10-CM | POA: Insufficient documentation

## 2016-08-05 MED ORDER — IBUPROFEN 800 MG PO TABS: 800.0000 mg | ORAL_TABLET | Freq: Three times a day (TID) | ORAL | 0 refills | Status: DC

## 2016-08-05 MED ORDER — IBUPROFEN 800 MG PO TABS
800.0000 mg | ORAL_TABLET | Freq: Once | ORAL | Status: AC
  Administered 2016-08-05: 800 mg via ORAL
  Filled 2016-08-05: qty 1

## 2016-08-05 MED FILL — IBUPROFEN 800 MG TABLET: 800 | 7 days supply | Qty: 21 | Fill #0

## 2016-08-05 NOTE — ED Triage Notes (Signed)
Patient states that she has had left ankle pain since Sunday night. Patient denies any injury

## 2016-08-05 NOTE — ED Provider Notes (Signed)
Emergency Department Provider Note   I have reviewed the triage vital signs and the nursing notes.   HISTORY  Chief Complaint Ankle Pain   HPI [name] is a 25 y.o. female with PMH of DM, obesity, HTN, and migraine presents to the emergency department for evaluation of left lateral ankle pain. The pain started 2 days ago with no obvious injury or provoking event. Patient reports severe pain in the lateral ankle extending several inches up the leg. No joint redness or swelling noted. Patient has tried Motrin at home which gives her some temporary relief but then the pain returns. She has applied an Ace wrap to the ankle. She is able to walk but feels like she is "hobbling." No other joint pain or swelling. No fevers.   Past Medical History:  Diagnosis Date  . Diabetes mellitus without complication (HCC)   . Hypertension    age 25  . Migraine   . Morbid obesity (HCC)   . Ovarian cyst     Patient Active Problem List   Diagnosis Date Noted  . SIRS (systemic inflammatory response syndrome) (HCC) 04/06/2015  . Hyperglycemia 04/06/2015  . Nausea and vomiting 04/06/2015  . Migraine 04/06/2015  . Essential hypertension 04/06/2015  . Syncope 04/06/2015    Past Surgical History:  Procedure Laterality Date  . CHOLECYSTECTOMY    . TUBES REMOVED      Current Outpatient Rx  . Order #: [id number] Class: Print  . Order #: [id number] Class: Print    Allergies Review of patient's allergies indicates no known allergies.  Family History  Problem Relation Age of Onset  . Hypertension Mother     Social History Social History  Substance Use Topics  . Smoking status: Current Some Day Smoker    Packs/day: 3.00    Types: Cigarettes  . Smokeless tobacco: Never Used  . Alcohol use Yes     Comment: occassionally    Review of Systems  Constitutional: No fever/chills Eyes: No visual changes. ENT: No sore throat. Cardiovascular: Denies chest pain. Respiratory: Denies  shortness of breath. Gastrointestinal: No abdominal pain.  No nausea, no vomiting.  No diarrhea.  No constipation. Genitourinary: Negative for dysuria. Musculoskeletal: Negative for back pain. Positive left ankle pain.  Skin: Negative for rash. Neurological: Negative for headaches, focal weakness or numbness.  10-point ROS otherwise negative.  ____________________________________________   PHYSICAL EXAM:  VITAL SIGNS: ED Triage Vitals [08/05/16 1039]  Enc Vitals Group     BP 140/88     Pulse Rate 93     Resp 18     Temp 98.2 F (36.8 C)     Temp Source Oral     SpO2 98 %     Weight 241 lb (109.3 kg)     Height 5\' 8"  (1.727 m)   Constitutional: Alert and oriented. Well appearing and in no acute distress. Eyes: Conjunctivae are normal.  Head: Atraumatic. Nose: No congestion/rhinnorhea. Mouth/Throat: Mucous membranes are moist.  Oropharynx non-erythematous. Neck: No stridor.  Cardiovascular: Normal rate, regular rhythm. Good peripheral circulation. Grossly normal heart sounds.   Respiratory: Normal respiratory effort.  No retractions. Lungs CTAB. Gastrointestinal: Soft and nontender. No distention.  Musculoskeletal: No lower extremity edema. Positive tenderness to palpation over the medial malleolus. No appreciable redness or swelling. Left foot is neurovascularly intact. No gross deformities of extremities. Neurologic:  Normal speech and language. No gross focal neurologic deficits are appreciated.  Skin:  Skin is warm, dry and intact. No rash noted.  ____________________________________________  RADIOLOGY  Dg Ankle Complete Left  Result Date: 08/05/2016 CLINICAL DATA:  Left lateral ankle pain common no known injury, initial encounter EXAM: LEFT ANKLE COMPLETE - 3+ VIEW COMPARISON:  None. FINDINGS: There is no evidence of fracture, dislocation, or joint effusion. There is no evidence of arthropathy or other focal bone abnormality. Soft tissues are unremarkable.  IMPRESSION: No acute abnormality noted. Electronically Signed   By: Alcide Clever M.D.   On: 08/05/2016 11:05    ____________________________________________   PROCEDURES  Procedure(s) performed:   Procedures  None ____________________________________________   INITIAL IMPRESSION / ASSESSMENT AND PLAN / ED COURSE  Pertinent labs & imaging results that were available during my care of the patient were reviewed by me and considered in my medical decision making (see chart for details).  Patient resents to the emergency department for evaluation of left lateral ankle pain for the past 2 days. There is no erythema or swelling. No history includes or physical exam findings to suggest septic arthritis. Patient does not recall trauma to the ankle or obvious provoking event. Her tenderness at the lateral malleolus and proximal to this I plan for x-ray of the left ankle. Will give Motrin here in the emergency department and reassess after x-ray.   11:48 AM X-rays negative for acute fracture or dislocation. Plan for Ace wrap and crutches here. She will follow with her primary care physician. Will prescribe 800 mg Motrin for home. Discussed return precautions in detail and the potential for orthopedic follow-up in one month if symptoms are not significantly improved.   At this time, I do not feel there is any life-threatening condition present. I have reviewed and discussed all results (EKG, imaging, lab, urine as appropriate), exam findings with patient. I have reviewed nursing notes and appropriate previous records.  I feel the patient is safe to be discharged home without further emergent workup. Discussed usual and customary return precautions. Patient and family (if present) verbalize understanding and are comfortable with this plan.  Patient will follow-up with their primary care provider. If they do not have a primary care provider, information for follow-up has been provided to them. All  questions have been answered.  ____________________________________________  FINAL CLINICAL IMPRESSION(S) / ED DIAGNOSES  Final diagnoses:  Acute left ankle pain     MEDICATIONS GIVEN DURING THIS VISIT:  Medications  ibuprofen (ADVIL,MOTRIN) tablet 800 mg (800 mg Oral Given 08/05/16 1136)     NEW OUTPATIENT MEDICATIONS STARTED DURING THIS VISIT:  New Prescriptions   IBUPROFEN (ADVIL,MOTRIN) 800 MG TABLET    Take 1 tablet (800 mg total) by mouth 3 (three) times daily.      Note:  This document was prepared using Dragon voice recognition software and may include unintentional dictation errors.  Alona Bene, MD Emergency Medicine   Maia Plan, MD 08/05/16 [phone]

## 2016-08-05 NOTE — Discharge Instructions (Signed)

## 2016-11-21 IMAGING — CT CT HEAD W/O CM
1 series · 15 of 30 positions shown, 19 images · non-contrast
Comparison: None.

CLINICAL DATA: Headache, loss of consciousness.

EXAM:
CT HEAD WITHOUT CONTRAST
TECHNIQUE: Contiguous axial images were obtained from the base of the skull
through the vertex without intravenous contrast.

[Series 2: head wo · axial · 0.44mm/px · z∈[-215,-89]mm · 15 of 32 slices shown, 19 images]
[im 2/32  brain]
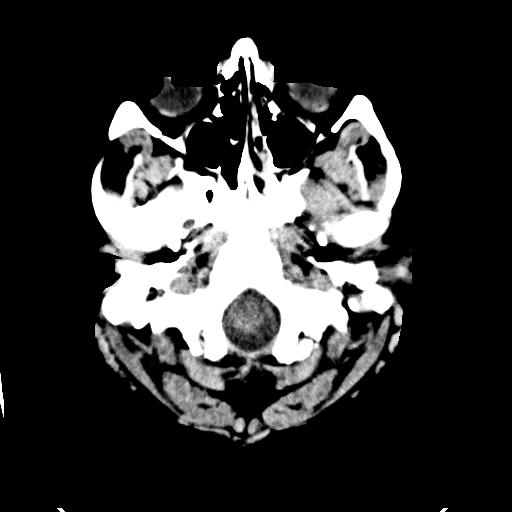
[im 2/32  bone]
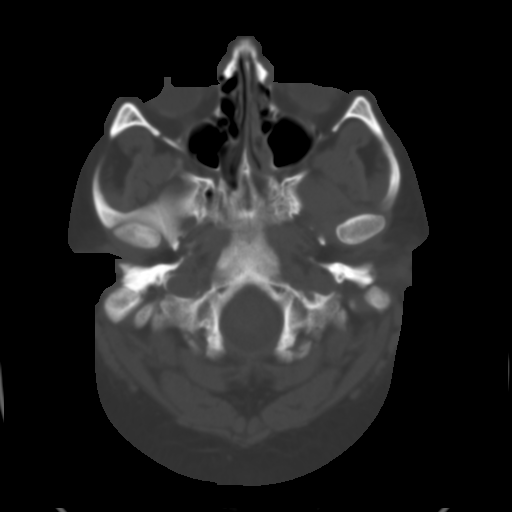
[im 4/32  brain]
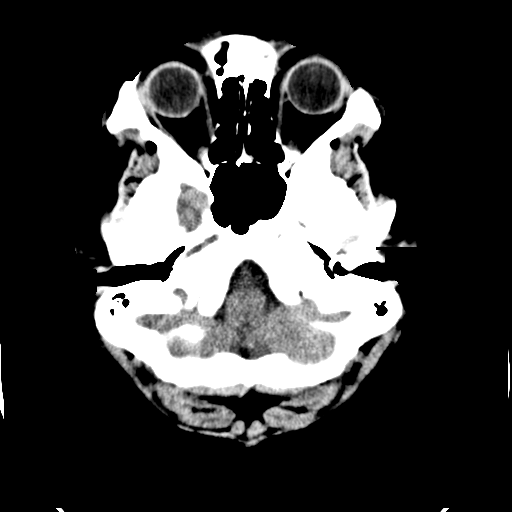
[im 6/32  brain]
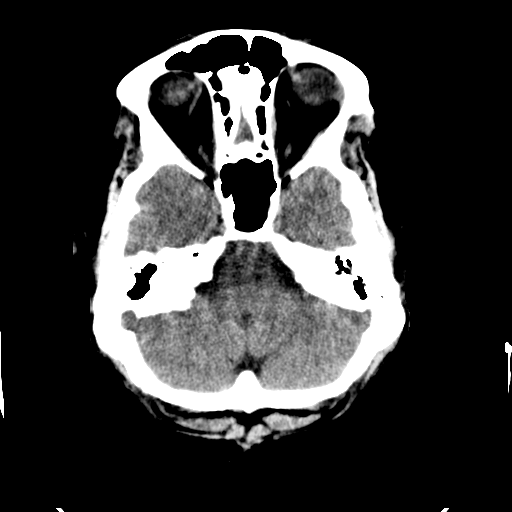
[im 8/32  brain]
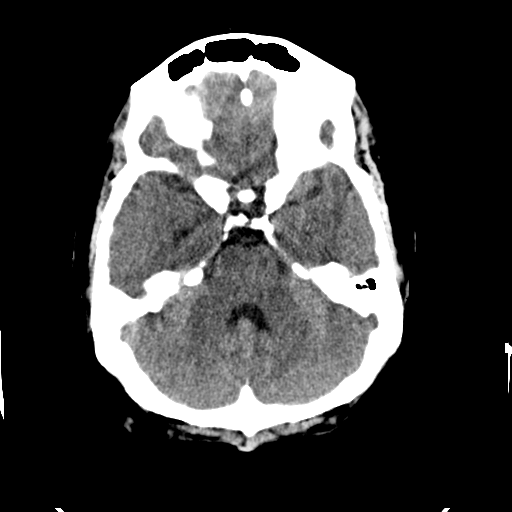
[im 10/32  brain]
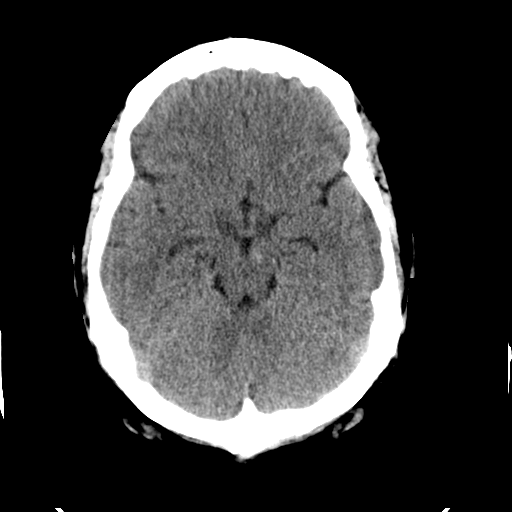
[im 10/32  bone]
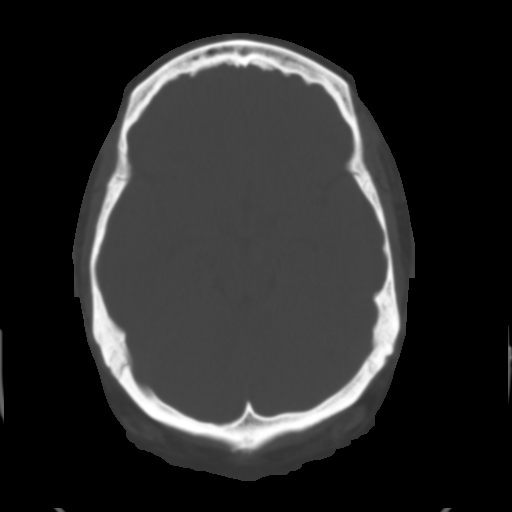
[im 12/32  brain]
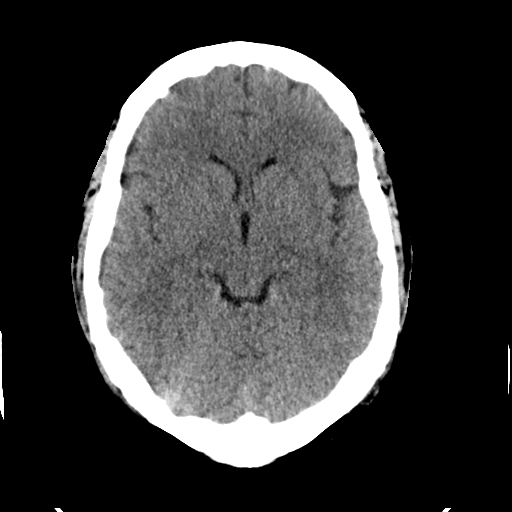
[im 14/32  brain]
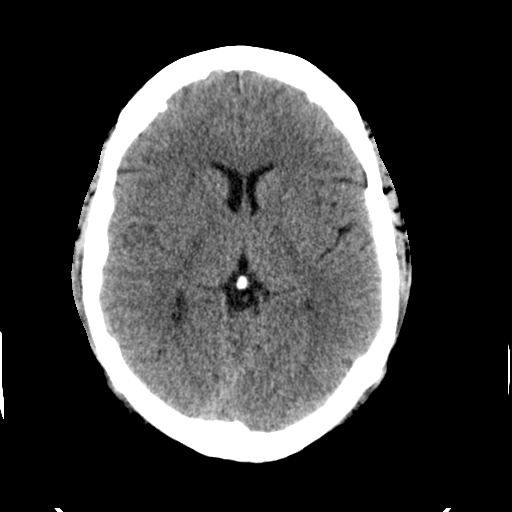
[im 17/32  brain]
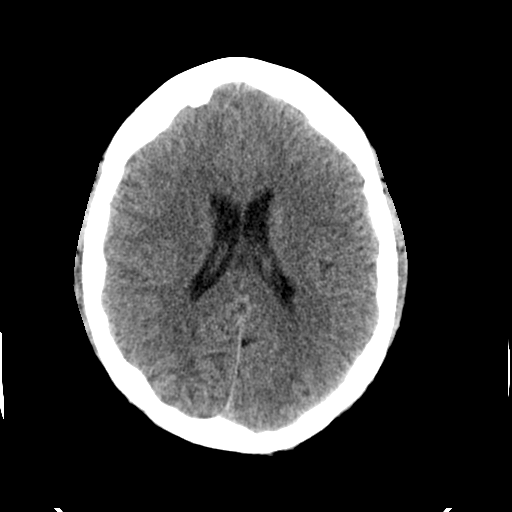
[im 18/32  brain]
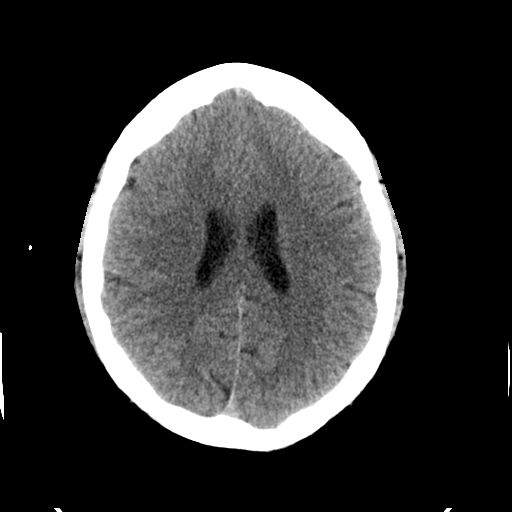
[im 18/32  bone]
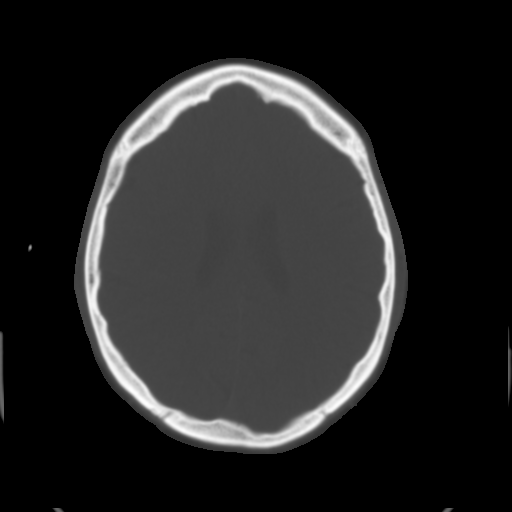
[im 20/32  brain]
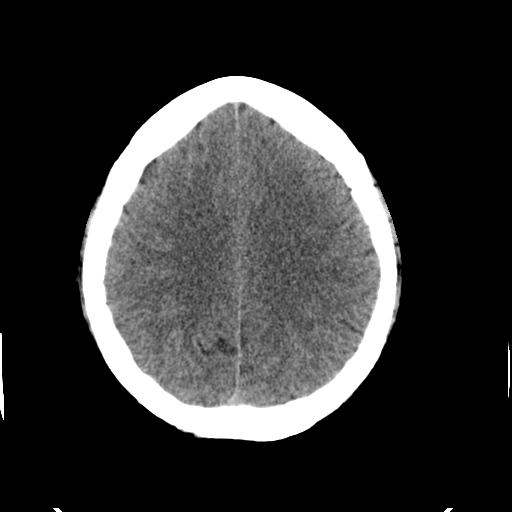
[im 22/32  brain]
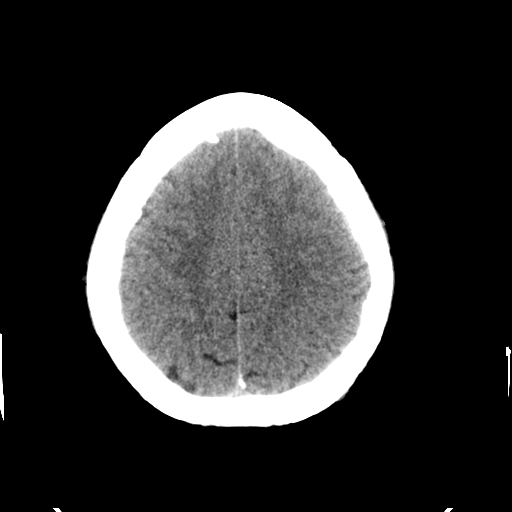
[im 24/32  brain]
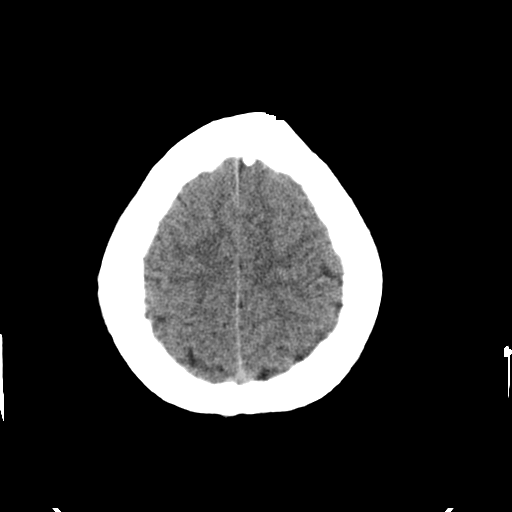
[im 26/32  brain]
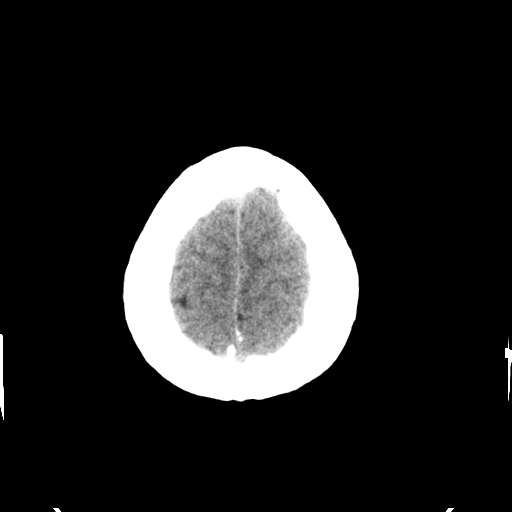
[im 26/32  bone]
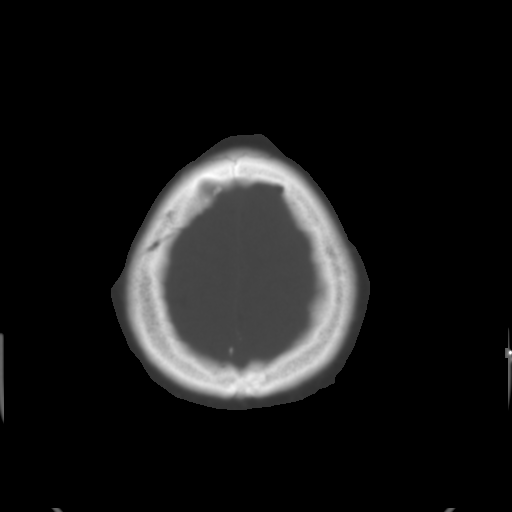
[im 28/32  brain]
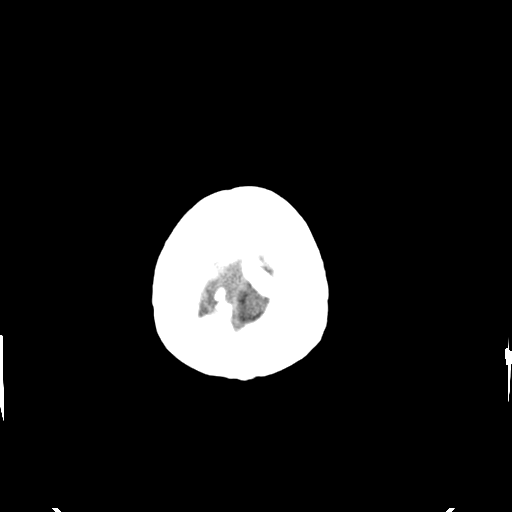
[im 30/32  brain]
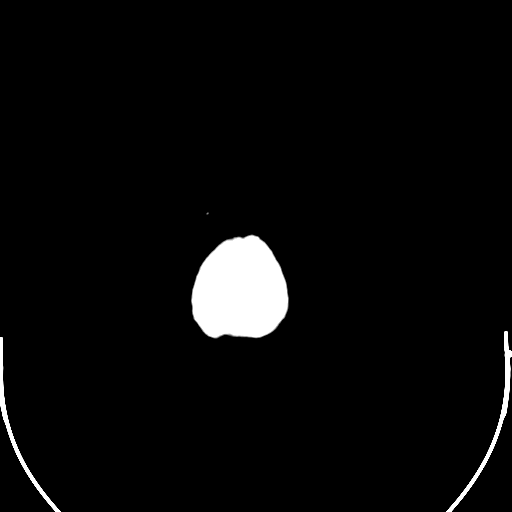

[15 of 30 positions shown; findings below may reference images not displayed]

FINDINGS: No intracranial hemorrhage, mass effect, or midline shift. No
hydrocephalus. The basilar cisterns are patent. No evidence of
territorial infarct. No intracranial fluid collection. There is no
calvarial fracture. Within the left frontal bone is a 1.4 cm lesion
with ground-glass matrix, well-defined borders and no extension to
the inner table. Included paranasal sinuses and mastoid air cells
are well aerated.
IMPRESSION: 1.  No acute intracranial abnormality.
2. Small focal 1.4 cm ground-glass lesion in the left frontal bone.
This is likely a small focus of fibrous dysplasia and of doubtful
clinical significance. There are no aggressive characteristics. If
there is focal pain referable to this region, MRI without with
contrast could be considered. Otherwise this is likely incidental
and in the absence of clinical symptoms, no further follow-up is
needed.

## 2017-01-31 ENCOUNTER — Emergency Department (HOSPITAL_BASED_OUTPATIENT_CLINIC_OR_DEPARTMENT_OTHER)
Admission: EM | Admit: 2017-01-31 | Discharge: 2017-01-31 | Discharge status: Against Medical Advice | Payer: Self-pay | Attending: Emergency Medicine | Admitting: Emergency Medicine

## 2017-01-31 ENCOUNTER — Encounter (HOSPITAL_BASED_OUTPATIENT_CLINIC_OR_DEPARTMENT_OTHER): Payer: Self-pay | Admitting: *Deleted

## 2017-01-31 DIAGNOSIS — R0981 Nasal congestion: Secondary | ICD-10-CM | POA: Insufficient documentation

## 2017-01-31 DIAGNOSIS — I10 Essential (primary) hypertension: Secondary | ICD-10-CM | POA: Insufficient documentation

## 2017-01-31 DIAGNOSIS — R112 Nausea with vomiting, unspecified: Secondary | ICD-10-CM | POA: Insufficient documentation

## 2017-01-31 DIAGNOSIS — F17210 Nicotine dependence, cigarettes, uncomplicated: Secondary | ICD-10-CM | POA: Insufficient documentation

## 2017-01-31 DIAGNOSIS — R51 Headache: Secondary | ICD-10-CM | POA: Insufficient documentation

## 2017-01-31 DIAGNOSIS — E119 Type 2 diabetes mellitus without complications: Secondary | ICD-10-CM | POA: Insufficient documentation

## 2017-01-31 DIAGNOSIS — Z9114 Patient's other noncompliance with medication regimen: Secondary | ICD-10-CM | POA: Insufficient documentation

## 2017-01-31 NOTE — ED Notes (Signed)
Patient upset that the MD at the bedside talking to security about escorting her out. The patient left after talking with the dr. Prior to getting her d/c instructions since per the MD "she was unhappy"

## 2017-01-31 NOTE — ED Triage Notes (Addendum)
Vomiting, facial pressure and lightheaded when she bends over and stands back up. Headache. Symptoms x 3 days. FYI she stopped taking her BP and Diabetic medication 3 months ago.

## 2017-01-31 NOTE — ED Provider Notes (Signed)
[facility] Provider Note   CSN: [id number] Arrival date & time: 01/31/17  1036     History   Chief Complaint Chief Complaint  Patient presents with  . Emesis  . Facial Pain    HPI [name] is a 26 y.o. female.Complains of nasal congestion for the past 3 days with vomiting one time today, 2 times yesterday and 2 days ago. No nausea present. Also complains of mild diffuse headache. No fever. No other associated symptoms. She admits to noncompliance with metformin and with blood pressure medicine  HPI  Past Medical History:  Diagnosis Date  . Diabetes mellitus without complication (HCC)   . Hypertension    age 21  . Migraine   . Morbid obesity (HCC)   . Ovarian cyst     Patient Active Problem List   Diagnosis Date Noted  . SIRS (systemic inflammatory response syndrome) (HCC) 04/06/2015  . Hyperglycemia 04/06/2015  . Nausea and vomiting 04/06/2015  . Migraine 04/06/2015  . Essential hypertension 04/06/2015  . Syncope 04/06/2015    Past Surgical History:  Procedure Laterality Date  . CHOLECYSTECTOMY    . TUBES REMOVED      OB History    No data available       Home Medications    Prior to Admission medications   Medication Sig Start Date End Date Taking? Authorizing Provider  ibuprofen (ADVIL,MOTRIN) 800 MG tablet Take 1 tablet (800 mg total) by mouth 3 (three) times daily. 08/05/16   Maia Plan, MD  metFORMIN (GLUCOPHAGE) 500 MG tablet Take 1 tablet (500 mg total) by mouth 2 (two) times daily with a meal. 04/29/15   Jerelyn Scott, MD    Family History Family History  Problem Relation Age of Onset  . Hypertension Mother     Social History Social History  Substance Use Topics  . Smoking status: Current Some Day Smoker    Packs/day: 3.00    Types: Cigarettes  . Smokeless tobacco: Never Used  . Alcohol use Yes     Comment: occassionally     Allergies   Patient has no known allergies.   Review of Systems Review of  Systems  HENT: Positive for congestion.   Gastrointestinal: Positive for nausea and vomiting.  Allergic/Immunologic: Positive for immunocompromised state.       Diabetic  Neurological: Positive for headaches.  All other systems reviewed and are negative.    Physical Exam Updated Vital Signs BP (!) 147/80   Pulse 74   Temp 98.4 F (36.9 C) (Oral)   Resp 16   Ht 5' 8.5" (1.74 m)   Wt 251 lb (113.9 kg)   LMP 01/29/2017   SpO2 99%   BMI 37.61 kg/m   Physical Exam  Constitutional: She is oriented to person, place, and time. She appears well-developed and well-nourished. No distress.  Nasal congestion  HENT:  Head: Normocephalic and atraumatic.  No facial asymmetry  Cardiovascular: Normal rate.   Pulmonary/Chest: Effort normal.  Abdominal:  Obese  Neurological: She is alert and oriented to person, place, and time. No cranial nerve deficit.  Psychiatric: She has a normal mood and affect. Her behavior is normal. Judgment and thought content normal.  Nursing note and vitals reviewed.    ED Treatments / Results  Labs (all labs ordered are listed, but only abnormal results are displayed) Labs Reviewed - No data to display  EKG  EKG Interpretation None       Radiology No results found.  Procedures  Procedures (including critical care time)  Medications Ordered in ED Medications - No data to display   Initial Impression / Assessment and Plan / ED Course  I have reviewed the triage vital signs and the nursing notes.  Pertinent labs & imaging results that were available during my care of the patient were reviewed by me and considered in my medical decision making (see chart for details).     Patient's fianc was in the room, with patient's permission when I interviewed her. He was aggressive and cursed at me. I had him escorted out by security. Patient did not wish further evaluation and left the ED. She is of sound mind to refuse further care or  evaluation  Final Clinical Impressions(s) / ED Diagnoses  Diagnoses #1 nasal congestion #2 headache #3 nausea and vomiting #4 medication noncompliance Final diagnoses:  Nausea and vomiting in adult    New Prescriptions New Prescriptions   No medications on file     Doug Sou, MD 01/31/17 1159

## 2017-01-31 NOTE — ED Notes (Signed)
Pt's boyfriend cursing at EDP for not prescribing narcotics - EDP asked significant other to leave - pt decided not to stay in ED after boyfriend escorted out by security.

## 2017-04-06 ENCOUNTER — Encounter (HOSPITAL_BASED_OUTPATIENT_CLINIC_OR_DEPARTMENT_OTHER): Payer: Self-pay | Admitting: *Deleted

## 2017-04-06 ENCOUNTER — Emergency Department (HOSPITAL_BASED_OUTPATIENT_CLINIC_OR_DEPARTMENT_OTHER)
Admission: EM | Admit: 2017-04-06 | Discharge: 2017-04-06 | Disposition: A | Discharge status: Home / Self Care | Payer: Self-pay | Attending: Emergency Medicine | Admitting: Emergency Medicine

## 2017-04-06 DIAGNOSIS — F17210 Nicotine dependence, cigarettes, uncomplicated: Secondary | ICD-10-CM | POA: Insufficient documentation

## 2017-04-06 DIAGNOSIS — M546 Pain in thoracic spine: Secondary | ICD-10-CM

## 2017-04-06 DIAGNOSIS — Z7984 Long term (current) use of oral hypoglycemic drugs: Secondary | ICD-10-CM | POA: Insufficient documentation

## 2017-04-06 DIAGNOSIS — E119 Type 2 diabetes mellitus without complications: Secondary | ICD-10-CM | POA: Insufficient documentation

## 2017-04-06 DIAGNOSIS — I10 Essential (primary) hypertension: Secondary | ICD-10-CM | POA: Insufficient documentation

## 2017-04-06 LAB — URINALYSIS, ROUTINE W REFLEX MICROSCOPIC
BILIRUBIN URINE: NEGATIVE
Glucose, UA: >=500 mg/dL — AB
Hgb urine dipstick: NEGATIVE
Ketones, ur: 15 mg/dL — AB
LEUKOCYTES UA: NEGATIVE
NITRITE: NEGATIVE
PROTEIN: NEGATIVE mg/dL
Specific Gravity, Urine: 1.028 (ref 1.005–1.030)
pH: 5.5 (ref 5.0–8.0)

## 2017-04-06 LAB — URINALYSIS, MICROSCOPIC (REFLEX)

## 2017-04-06 LAB — PREGNANCY, URINE: PREG TEST UR: NEGATIVE

## 2017-04-06 MED ORDER — HYDROCODONE-ACETAMINOPHEN 5-325 MG PO TABS
2.0000 | ORAL_TABLET | Freq: Once | ORAL | Status: AC
Start: 2017-04-06 — End: 2017-04-06
  Administered 2017-04-06: 2 via ORAL
  Filled 2017-04-06: qty 2

## 2017-04-06 MED ORDER — HYDROCODONE-ACETAMINOPHEN 5-325 MG PO TABS: 1.0000 | ORAL_TABLET | Freq: Four times a day (QID) | ORAL | 0 refills | Status: DC | PRN

## 2017-04-06 MED FILL — HYDROCODON-APAP 5-325: 5-325 | 5 days supply | Qty: 20 | Fill #0

## 2017-04-06 NOTE — ED Provider Notes (Addendum)
[facility] Provider Note: Lowella Dell, MD, FACEP  CSN: [id number] MRN: [id number] ARRIVAL: 04/06/17 at 0306 ROOM: [facility]   CHIEF COMPLAINT  Back Pain   HISTORY OF PRESENT ILLNESS  [name] is a 26 y.o. female with a one-day history of pain in her back. The pain is located across her lower thoracic back. She denies lifting or other injury. The pain is dull and she rates it as a 9 out of 10. Pain is worse with movement. It does not radiate. She denies dysuria. She does acknowledge irregular periods but has never been diagnosed with PCOS. She has taken ibuprofen without relief.  Consultation with the [facility] state controlled substances database reveals the patient has received No opioid prescriptions in the past year.   Past Medical History:  Diagnosis Date  . Diabetes mellitus without complication (HCC)   . Hypertension    age 29  . Migraine   . Morbid obesity (HCC)   . Ovarian cyst     Past Surgical History:  Procedure Laterality Date  . CHOLECYSTECTOMY    . TUBES REMOVED      Family History  Problem Relation Age of Onset  . Hypertension Mother     Social History  Substance Use Topics  . Smoking status: Current Some Day Smoker    Packs/day: 3.00    Types: Cigarettes  . Smokeless tobacco: Never Used  . Alcohol use Yes     Comment: occassionally    Prior to Admission medications   Medication Sig Start Date End Date Taking? Authorizing Provider  ibuprofen (ADVIL,MOTRIN) 800 MG tablet Take 1 tablet (800 mg total) by mouth 3 (three) times daily. 08/05/16   Long, Arlyss Repress, MD  metFORMIN (GLUCOPHAGE) 500 MG tablet Take 1 tablet (500 mg total) by mouth 2 (two) times daily with a meal. 04/29/15   Jerelyn Scott, MD    Allergies Patient has no known allergies.   REVIEW OF SYSTEMS  Negative except as noted here or in the History of Present Illness.   PHYSICAL EXAMINATION  Initial Vital Signs Blood pressure (!) 147/97, pulse 92,  temperature 98.3 F (36.8 C), temperature source Oral, resp. rate (!) 24, height 5' 8.5" (1.74 m), weight 113.9 kg (251 lb), last menstrual period 02/23/2017, SpO2 100 %.  Examination General: Well-developed, well-nourished female in no acute distress; appearance consistent with age of record HENT: normocephalic; atraumatic Eyes: pupils equal, round and reactive to light; extraocular muscles intact Neck: supple Heart: regular rate and rhythm Lungs: clear to auscultation bilaterally Abdomen: soft; nondistended; nontender; bowel sounds present Back: Lower thoracic paraspinal tenderness bilaterally; pain on movement of back Extremities: No deformity; full range of motion; pulses normal Neurologic: Awake, alert and oriented; motor function intact in all extremities and symmetric; no facial droop Skin: Warm and dry; facial hirsutism Psychiatric: Grimacing   RESULTS  Summary of this visit's results, reviewed by myself:   EKG Interpretation  Date/Time:    Ventricular Rate:    PR Interval:    QRS Duration:   QT Interval:    QTC Calculation:   R Axis:     Text Interpretation:        Laboratory Studies: Results for orders placed or performed during the hospital encounter of 04/06/17 (from the past 24 hour(s))  Pregnancy, urine     Status: None   Collection Time: 04/06/17  3:11 AM  Result Value Ref Range   Preg Test, Ur NEGATIVE NEGATIVE  Urinalysis, Routine w reflex microscopic  Status: Abnormal   Collection Time: 04/06/17  3:11 AM  Result Value Ref Range   Color, Urine YELLOW YELLOW   APPearance CLEAR CLEAR   Specific Gravity, Urine 1.028 1.005 - 1.030   pH 5.5 5.0 - 8.0   Glucose, UA >=500 (A) NEGATIVE mg/dL   Hgb urine dipstick NEGATIVE NEGATIVE   Bilirubin Urine NEGATIVE NEGATIVE   Ketones, ur 15 (A) NEGATIVE mg/dL   Protein, ur NEGATIVE NEGATIVE mg/dL   Nitrite NEGATIVE NEGATIVE   Leukocytes, UA NEGATIVE NEGATIVE  Urinalysis, Microscopic (reflex)     Status:  Abnormal   Collection Time: 04/06/17  3:11 AM  Result Value Ref Range   RBC / HPF 0-5 0 - 5 RBC/hpf   WBC, UA 0-5 0 - 5 WBC/hpf   Bacteria, UA FEW (A) NONE SEEN   Squamous Epithelial / LPF 0-5 (A) NONE SEEN   Mucous PRESENT    Imaging Studies: No results found.  ED COURSE  Nursing notes and initial vitals signs, including pulse oximetry, reviewed.  Vitals:   04/06/17 0315  BP: (!) 147/97  Pulse: 92  Resp: (!) 24  Temp: 98.3 F (36.8 C)  TempSrc: Oral  SpO2: 100%  Weight: 113.9 kg (251 lb)  Height: 5' 8.5" (1.74 m)    PROCEDURES    ED DIAGNOSES     ICD-10-CM   1. Acute bilateral thoracic back pain M54.6        Dajaun Goldring, Jonny RuizJohn, MD 04/06/17 0342    Paula LibraMolpus, Rockwell Zentz, MD 04/06/17 [phone]

## 2017-04-06 NOTE — ED Triage Notes (Signed)
C/o back pain onset yesterday  Unknown if any inj, denies urinary sx

## 2017-04-18 ENCOUNTER — Encounter (HOSPITAL_BASED_OUTPATIENT_CLINIC_OR_DEPARTMENT_OTHER): Payer: Self-pay | Admitting: *Deleted

## 2017-04-18 ENCOUNTER — Emergency Department (HOSPITAL_BASED_OUTPATIENT_CLINIC_OR_DEPARTMENT_OTHER)
Admission: EM | Admit: 2017-04-18 | Discharge: 2017-04-18 | Disposition: A | Discharge status: Home / Self Care | Payer: Self-pay | Attending: Emergency Medicine | Admitting: Emergency Medicine

## 2017-04-18 DIAGNOSIS — R197 Diarrhea, unspecified: Secondary | ICD-10-CM | POA: Insufficient documentation

## 2017-04-18 DIAGNOSIS — F17210 Nicotine dependence, cigarettes, uncomplicated: Secondary | ICD-10-CM | POA: Insufficient documentation

## 2017-04-18 DIAGNOSIS — R112 Nausea with vomiting, unspecified: Secondary | ICD-10-CM | POA: Insufficient documentation

## 2017-04-18 DIAGNOSIS — Z7984 Long term (current) use of oral hypoglycemic drugs: Secondary | ICD-10-CM | POA: Insufficient documentation

## 2017-04-18 DIAGNOSIS — E119 Type 2 diabetes mellitus without complications: Secondary | ICD-10-CM | POA: Insufficient documentation

## 2017-04-18 DIAGNOSIS — I10 Essential (primary) hypertension: Secondary | ICD-10-CM | POA: Insufficient documentation

## 2017-04-18 LAB — COMPREHENSIVE METABOLIC PANEL WITH GFR
ALT: 31 U/L (ref 14–54)
AST: 34 U/L (ref 15–41)
Albumin: 4 g/dL (ref 3.5–5.0)
Alkaline Phosphatase: 61 U/L (ref 38–126)
Anion gap: 11 (ref 5–15)
BUN: 11 mg/dL (ref 6–20)
CO2: 20 mmol/L — ABNORMAL LOW (ref 22–32)
Calcium: 8.7 mg/dL — ABNORMAL LOW (ref 8.9–10.3)
Chloride: 102 mmol/L (ref 101–111)
Creatinine, Ser: 0.61 mg/dL (ref 0.44–1.00)
GFR calc Af Amer: >60 mL/min
GFR calc non Af Amer: >60 mL/min
Glucose, Bld: 239 mg/dL — ABNORMAL HIGH (ref 65–99)
Potassium: 3.6 mmol/L (ref 3.5–5.1)
Sodium: 133 mmol/L — ABNORMAL LOW (ref 135–145)
Total Bilirubin: 0.7 mg/dL (ref 0.3–1.2)
Total Protein: 8.2 g/dL — ABNORMAL HIGH (ref 6.5–8.1)

## 2017-04-18 LAB — CBC WITH DIFFERENTIAL/PLATELET
Basophils Absolute: 0 K/uL (ref 0.0–0.1)
Basophils Relative: 0 %
Eosinophils Absolute: 0.2 K/uL (ref 0.0–0.7)
Eosinophils Relative: 2 %
HCT: 40.9 % (ref 36.0–46.0)
Hemoglobin: 14.7 g/dL (ref 12.0–15.0)
Lymphocytes Relative: 17 %
Lymphs Abs: 2.1 K/uL (ref 0.7–4.0)
MCH: 29.7 pg (ref 26.0–34.0)
MCHC: 35.9 g/dL (ref 30.0–36.0)
MCV: 82.6 fL (ref 78.0–100.0)
Monocytes Absolute: 0.8 K/uL (ref 0.1–1.0)
Monocytes Relative: 6 %
Neutro Abs: 9.2 K/uL — ABNORMAL HIGH (ref 1.7–7.7)
Neutrophils Relative %: 75 %
Platelets: 244 K/uL (ref 150–400)
RBC: 4.95 MIL/uL (ref 3.87–5.11)
RDW: 12.8 % (ref 11.5–15.5)
WBC: 12.3 K/uL — ABNORMAL HIGH (ref 4.0–10.5)

## 2017-04-18 LAB — URINALYSIS, ROUTINE W REFLEX MICROSCOPIC
Bilirubin Urine: NEGATIVE
Glucose, UA: 250 mg/dL — AB
Hgb urine dipstick: NEGATIVE
Ketones, ur: NEGATIVE mg/dL
Leukocytes, UA: NEGATIVE
Nitrite: NEGATIVE
Protein, ur: NEGATIVE mg/dL
Specific Gravity, Urine: 1.03 (ref 1.005–1.030)
pH: 5 (ref 5.0–8.0)

## 2017-04-18 LAB — PREGNANCY, URINE: Preg Test, Ur: NEGATIVE

## 2017-04-18 LAB — LIPASE, BLOOD: Lipase: 34 U/L (ref 11–51)

## 2017-04-18 MED ORDER — MORPHINE SULFATE (PF) 4 MG/ML IV SOLN
4.0000 mg | Freq: Once | INTRAVENOUS | Status: AC
  Administered 2017-04-18: 4 mg via INTRAVENOUS
  Filled 2017-04-18: qty 1

## 2017-04-18 MED ORDER — DICYCLOMINE HCL 20 MG PO TABS: 20.0000 mg | ORAL_TABLET | Freq: Two times a day (BID) | ORAL | 0 refills | Status: DC

## 2017-04-18 MED ORDER — ONDANSETRON HCL 4 MG/2ML IJ SOLN
4.0000 mg | INTRAMUSCULAR | Status: AC
  Administered 2017-04-18: 4 mg via INTRAVENOUS
  Filled 2017-04-18: qty 2

## 2017-04-18 MED ORDER — SODIUM CHLORIDE 0.9 % IV BOLUS (SEPSIS)
1000.0000 mL | Freq: Once | INTRAVENOUS | Status: AC
  Administered 2017-04-18: 1000 mL via INTRAVENOUS

## 2017-04-18 MED ORDER — ONDANSETRON HCL 4 MG PO TABS: 4.0000 mg | ORAL_TABLET | Freq: Three times a day (TID) | ORAL | 0 refills | Status: DC | PRN

## 2017-04-18 NOTE — ED Provider Notes (Signed)
[facility]   CSN: [id number] Arrival date & time: 04/18/17  1817  By signing my name below, I, Rosana Fretana Waskiewicz, attest that this documentation has been prepared under the direction and in the presence of Arthor CaptainAbigail Toyia Jelinek, PA-C.  Electronically Signed: Rosana Fretana Waskiewicz, ED Scribe. 04/18/17. 9:15 PM.  History   Chief Complaint Chief Complaint  Patient presents with  . Abdominal Pain   The history is provided by the patient. No language interpreter was used.   HPI Comments: [name] is a 26 y.o. female with a PMHx of DM, who presents to the Emergency Department complaining of constant, 8/10 abdominal pain onset today. Pt reports associated vomiting and watery diarrhea. No similar sick contacts. No foreign travel. Pt has had a cholecystectomy and a fallopian tube removed. Pt denies urinary symptoms or any other complaints at this time.  Past Medical History:  Diagnosis Date  . Diabetes mellitus without complication (HCC)   . Hypertension    age 26  . Migraine   . Morbid obesity (HCC)   . Ovarian cyst     Patient Active Problem List   Diagnosis Date Noted  . SIRS (systemic inflammatory response syndrome) (HCC) 04/06/2015  . Hyperglycemia 04/06/2015  . Nausea and vomiting 04/06/2015  . Migraine 04/06/2015  . Essential hypertension 04/06/2015  . Syncope 04/06/2015    Past Surgical History:  Procedure Laterality Date  . CHOLECYSTECTOMY    . TUBES REMOVED      OB History    No data available       Home Medications    Prior to Admission medications   Medication Sig Start Date End Date Taking? Authorizing Provider  HYDROcodone-acetaminophen (NORCO) 5-325 MG tablet Take 1 tablet by mouth every 6 (six) hours as needed (for pain). 04/06/17   Molpus, John, MD  ibuprofen (ADVIL,MOTRIN) 800 MG tablet Take 1 tablet (800 mg total) by mouth 3 (three) times daily. 08/05/16   Long, Arlyss RepressJoshua G, MD  metFORMIN (GLUCOPHAGE) 500 MG tablet Take 1 tablet  (500 mg total) by mouth 2 (two) times daily with a meal. 04/29/15   Jerelyn ScottLinker, Martha, MD    Family History Family History  Problem Relation Age of Onset  . Hypertension Mother     Social History Social History  Substance Use Topics  . Smoking status: Current Some Day Smoker    Packs/day: 3.00    Types: Cigarettes  . Smokeless tobacco: Never Used  . Alcohol use Yes     Comment: occassionally     Allergies   Patient has no known allergies.   Review of Systems Review of Systems All other systems reviewed and are negative for acute change except as noted in the HPI.  Physical Exam Updated Vital Signs BP (!) 141/84 (BP Location: Left Arm)   Pulse (!) 110   Temp 98.7 F (37.1 C) (Oral)   Resp 16   Ht 5' 8.5" (1.74 m)   Wt 239 lb 3.2 oz (108.5 kg)   LMP 03/30/2017   SpO2 100%   BMI 35.84 kg/m   Physical Exam  Constitutional: She is oriented to person, place, and time. She appears well-developed and well-nourished. No distress.  Appears uncomfortable.   HENT:  Head: Normocephalic and atraumatic.  Eyes: Conjunctivae and EOM are normal. No scleral icterus.  Neck: Normal range of motion.  Cardiovascular: Normal rate, regular rhythm and normal heart sounds.  Exam reveals no gallop and no friction rub.   No murmur heard. Pulmonary/Chest: Effort normal  and breath sounds normal. No respiratory distress.  Abdominal: Soft. Bowel sounds are normal. She exhibits no distension and no mass. There is tenderness. There is no guarding.  Diffuse abdominal pain.  Musculoskeletal: Normal range of motion.  Neurological: She is alert and oriented to person, place, and time.  Skin: Skin is warm and dry. She is not diaphoretic.  Psychiatric: She has a normal mood and affect. Her behavior is normal. Judgment normal.  Nursing note and vitals reviewed.    ED Treatments / Results  DIAGNOSTIC STUDIES: Oxygen Saturation is 100% on RA, normal by my interpretation.   COORDINATION OF  CARE: 9:06 PM-Discussed next steps with pt including pain and nausea medication. Pt verbalized understanding and is agreeable with the plan.   Labs (all labs ordered are listed, but only abnormal results are displayed) Labs Reviewed  URINALYSIS, ROUTINE W REFLEX MICROSCOPIC - Abnormal; Notable for the following:       Result Value   Color, Urine AMBER (*)    APPearance CLOUDY (*)    Glucose, UA 250 (*)    All other components within normal limits  CBC WITH DIFFERENTIAL/PLATELET - Abnormal; Notable for the following:    WBC 12.3 (*)    Neutro Abs 9.2 (*)    All other components within normal limits  COMPREHENSIVE METABOLIC PANEL - Abnormal; Notable for the following:    Sodium 133 (*)    CO2 20 (*)    Glucose, Bld 239 (*)    Calcium 8.7 (*)    Total Protein 8.2 (*)    All other components within normal limits  PREGNANCY, URINE  LIPASE, BLOOD    EKG  EKG Interpretation None       Radiology No results found.  Procedures Procedures (including critical care time)  Medications Ordered in ED Medications  sodium chloride 0.9 % bolus 1,000 mL (1,000 mLs Intravenous New Bag/Given 04/18/17 2108)  ondansetron [facility]) injection 4 mg (4 mg Intravenous Given 04/18/17 2108)  morphine 4 MG/ML injection 4 mg (4 mg Intravenous Given 04/18/17 2108)     Initial Impression / Assessment and Plan / ED Course  I have reviewed the triage vital signs and the nursing notes.  Pertinent labs & imaging results that were available during my care of the patient were reviewed by me and considered in my medical decision making (see chart for details).       Patient with symptoms consistent with viral gastroenteritis.  Vitals are stable, no fever.  No signs of dehydration, tolerating PO fluids > 6 oz.  Lungs are clear.  No focal abdominal pain, no concern for appendicitis, cholecystitis, pancreatitis, ruptured viscus, UTI, kidney stone, or any other abdominal etiology.  Supportive therapy indicated  with return if symptoms worsen.  Patient counseled. .   Final Clinical Impressions(s) / ED Diagnoses   Final diagnoses:  Nausea vomiting and diarrhea    New Prescriptions New Prescriptions   No medications on file    I personally performed the services described in this documentation, which was scribed in my presence. The recorded information has been reviewed and is accurate.       Arthor Captain, PA-C 04/19/17 8295    Rolan Bucco, MD 04/19/17 [phone]

## 2017-04-18 NOTE — Discharge Instructions (Signed)

## 2017-04-18 NOTE — ED Triage Notes (Signed)
Pt c/o diffuse abd pain n/v/d x 1 day

## 2017-06-23 ENCOUNTER — Emergency Department (HOSPITAL_BASED_OUTPATIENT_CLINIC_OR_DEPARTMENT_OTHER): Payer: No Typology Code available for payment source

## 2017-06-23 ENCOUNTER — Encounter (HOSPITAL_BASED_OUTPATIENT_CLINIC_OR_DEPARTMENT_OTHER): Payer: Self-pay | Admitting: Emergency Medicine

## 2017-06-23 DIAGNOSIS — Z79899 Other long term (current) drug therapy: Secondary | ICD-10-CM | POA: Diagnosis not present

## 2017-06-23 DIAGNOSIS — J0100 Acute maxillary sinusitis, unspecified: Secondary | ICD-10-CM | POA: Diagnosis not present

## 2017-06-23 DIAGNOSIS — I10 Essential (primary) hypertension: Secondary | ICD-10-CM | POA: Insufficient documentation

## 2017-06-23 DIAGNOSIS — Y929 Unspecified place or not applicable: Secondary | ICD-10-CM | POA: Insufficient documentation

## 2017-06-23 DIAGNOSIS — Z7984 Long term (current) use of oral hypoglycemic drugs: Secondary | ICD-10-CM | POA: Insufficient documentation

## 2017-06-23 DIAGNOSIS — Y939 Activity, unspecified: Secondary | ICD-10-CM | POA: Insufficient documentation

## 2017-06-23 DIAGNOSIS — F17210 Nicotine dependence, cigarettes, uncomplicated: Secondary | ICD-10-CM | POA: Insufficient documentation

## 2017-06-23 DIAGNOSIS — S060X0A Concussion without loss of consciousness, initial encounter: Secondary | ICD-10-CM | POA: Insufficient documentation

## 2017-06-23 DIAGNOSIS — E119 Type 2 diabetes mellitus without complications: Secondary | ICD-10-CM | POA: Diagnosis not present

## 2017-06-23 DIAGNOSIS — S098XXA Other specified injuries of head, initial encounter: Secondary | ICD-10-CM | POA: Diagnosis present

## 2017-06-23 DIAGNOSIS — Y999 Unspecified external cause status: Secondary | ICD-10-CM | POA: Diagnosis not present
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
## 2017-06-24 ENCOUNTER — Emergency Department (HOSPITAL_BASED_OUTPATIENT_CLINIC_OR_DEPARTMENT_OTHER)
Admission: EM | Admit: 2017-06-24 | Discharge: 2017-06-24 | Disposition: A | Discharge status: Home / Self Care | Payer: No Typology Code available for payment source | Attending: Emergency Medicine | Admitting: Emergency Medicine

## 2017-06-24 DIAGNOSIS — S060X0A Concussion without loss of consciousness, initial encounter: Secondary | ICD-10-CM

## 2017-06-24 DIAGNOSIS — J320 Chronic maxillary sinusitis: Secondary | ICD-10-CM

## 2017-06-24 MED ORDER — CYCLOBENZAPRINE HCL 10 MG PO TABS
10.0000 mg | ORAL_TABLET | Freq: Once | ORAL | Status: AC
  Administered 2017-06-24: 10 mg via ORAL
  Filled 2017-06-24: qty 1

## 2017-06-24 MED ORDER — FLUTICASONE PROPIONATE 50 MCG/ACT NA SUSP: 2.0000 | Freq: Every day | NASAL | 2 refills | Status: DC

## 2017-06-24 MED ORDER — KETOROLAC TROMETHAMINE 60 MG/2ML IM SOLN
30.0000 mg | Freq: Once | INTRAMUSCULAR | Status: AC
  Administered 2017-06-24: 30 mg via INTRAMUSCULAR
  Filled 2017-06-24: qty 2

## 2017-06-24 MED ORDER — CYCLOBENZAPRINE HCL 10 MG PO TABS: 10.0000 mg | ORAL_TABLET | Freq: Two times a day (BID) | ORAL | 0 refills | Status: DC | PRN

## 2017-06-24 NOTE — Discharge Instructions (Signed)
Ibuprofen or tylenol for headache. Flexeril for muscle spasms. Flonase for sinus disease. Follow up with family doctor or Dr. Katrinka Blazing as referred.
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
## 2017-11-10 ENCOUNTER — Emergency Department (HOSPITAL_BASED_OUTPATIENT_CLINIC_OR_DEPARTMENT_OTHER)
Admission: EM | Admit: 2017-11-10 | Discharge: 2017-11-10 | Disposition: A | Discharge status: Home / Self Care | Payer: Self-pay | Attending: Emergency Medicine | Admitting: Emergency Medicine

## 2017-11-10 ENCOUNTER — Other Ambulatory Visit: Payer: Self-pay

## 2017-11-10 ENCOUNTER — Encounter (HOSPITAL_BASED_OUTPATIENT_CLINIC_OR_DEPARTMENT_OTHER): Payer: Self-pay | Admitting: Emergency Medicine

## 2017-11-10 DIAGNOSIS — R51 Headache: Secondary | ICD-10-CM | POA: Insufficient documentation

## 2017-11-10 DIAGNOSIS — F17210 Nicotine dependence, cigarettes, uncomplicated: Secondary | ICD-10-CM | POA: Insufficient documentation

## 2017-11-10 DIAGNOSIS — E119 Type 2 diabetes mellitus without complications: Secondary | ICD-10-CM | POA: Insufficient documentation

## 2017-11-10 DIAGNOSIS — R519 Headache, unspecified: Secondary | ICD-10-CM

## 2017-11-10 DIAGNOSIS — Z79899 Other long term (current) drug therapy: Secondary | ICD-10-CM | POA: Insufficient documentation

## 2017-11-10 DIAGNOSIS — I10 Essential (primary) hypertension: Secondary | ICD-10-CM | POA: Insufficient documentation

## 2017-11-10 MED ORDER — METOCLOPRAMIDE HCL 5 MG/ML IJ SOLN
10.0000 mg | Freq: Once | INTRAMUSCULAR | Status: AC
  Administered 2017-11-10: 10 mg via INTRAVENOUS
  Filled 2017-11-10: qty 2

## 2017-11-10 MED ORDER — SODIUM CHLORIDE 0.9 % IV BOLUS (SEPSIS)
1000.0000 mL | Freq: Once | INTRAVENOUS | Status: AC
  Administered 2017-11-10: 1000 mL via INTRAVENOUS

## 2017-11-10 MED ORDER — KETOROLAC TROMETHAMINE 30 MG/ML IJ SOLN
30.0000 mg | Freq: Once | INTRAMUSCULAR | Status: AC
  Administered 2017-11-10: 30 mg via INTRAVENOUS
  Filled 2017-11-10: qty 1

## 2017-11-10 MED ORDER — DIPHENHYDRAMINE HCL 50 MG/ML IJ SOLN
25.0000 mg | Freq: Once | INTRAMUSCULAR | Status: AC
  Administered 2017-11-10: 25 mg via INTRAVENOUS
  Filled 2017-11-10: qty 1

## 2017-11-10 NOTE — Discharge Instructions (Signed)
Alternate 600 mg of ibuprofen and [phone] mg of Tylenol every 3 hours as needed for pain. Do not exceed 4000 mg of Tylenol daily.  Drink plenty of fluids and get plenty of rest.  Follow-up with a primary care physician for reevaluation of your migraine headaches. return to the emergency department if any concerning signs or symptoms develop.

## 2017-11-10 NOTE — ED Triage Notes (Signed)
Headache x 4 days.  Usually takes ibuprofen and gets relief but not this time.  Pt states she is sensitive to light.  Some nausea.

## 2017-11-10 NOTE — ED Provider Notes (Signed)
[facility] EMERGENCY DEPARTMENT Provider Note   CSN: [id number] Arrival date & time: 11/10/17  1004     History   Chief Complaint Chief Complaint  Patient presents with  . Headache    HPI [name] is a 27 y.o. female DM, hypertension, migraines, morbid obesity, and ovarian cysts presents today with chief complaint gradual onset, progressively worsening generalized headache for 4 days.  She describes the pain as throbbing and sharp.  She states the last time she had a migraine like this was when she was a teenager.  Pain worsens when she lays back.  She denies neck stiffness or neck pain.  No recent travel, denies IV drug use.  She endorses photophobia and mild phonophobia.  Denies fevers, chills, chest pain, shortness of breath, abdominal pain, nausea, vomiting, diarrhea, numbness, tingling, or weakness.  She has tried ibuprofen, Excedrin Migraine, and Aleve but has had no relief of her symptoms with this.  Denies trauma or falls.  The history is provided by the patient.    Past Medical History:  Diagnosis Date  . Diabetes mellitus without complication (HCC)   . Hypertension    age 33  . Migraine   . Morbid obesity (HCC)   . Ovarian cyst     Patient Active Problem List   Diagnosis Date Noted  . SIRS (systemic inflammatory response syndrome) (HCC) 04/06/2015  . Hyperglycemia 04/06/2015  . Nausea and vomiting 04/06/2015  . Migraine 04/06/2015  . Essential hypertension 04/06/2015  . Syncope 04/06/2015    Past Surgical History:  Procedure Laterality Date  . CHOLECYSTECTOMY    . TUBES REMOVED      OB History    No data available       Home Medications    Prior to Admission medications   Medication Sig Start Date End Date Taking? Authorizing Provider  HYDROcodone-acetaminophen (NORCO) 5-325 MG tablet Take 1 tablet by mouth every 6 (six) hours as needed (for pain). 04/06/17   Molpus, John, MD  ibuprofen (ADVIL,MOTRIN) 800 MG tablet Take 1  tablet (800 mg total) by mouth 3 (three) times daily. 08/05/16   Long, Arlyss Repress, MD  metFORMIN (GLUCOPHAGE) 500 MG tablet Take 1 tablet (500 mg total) by mouth 2 (two) times daily with a meal. 04/29/15   Mabe, Latanya Maudlin, MD  ondansetron (ZOFRAN) 4 MG tablet Take 1 tablet (4 mg total) by mouth every 8 (eight) hours as needed for nausea or vomiting. 04/18/17   Arthor Captain, PA-C    Family History Family History  Problem Relation Age of Onset  . Hypertension Mother     Social History Social History   Tobacco Use  . Smoking status: Current Some Day Smoker    Packs/day: 3.00    Types: Cigarettes  . Smokeless tobacco: Never Used  Substance Use Topics  . Alcohol use: Yes    Comment: occassionally  . Drug use: No     Allergies   Patient has no known allergies.   Review of Systems Review of Systems  Constitutional: Negative for chills and fever.  Eyes: Positive for photophobia. Negative for visual disturbance.  Respiratory: Negative for shortness of breath.   Cardiovascular: Negative for chest pain.  Gastrointestinal: Negative for abdominal pain, nausea and vomiting.  Musculoskeletal: Negative for neck pain and neck stiffness.  Neurological: Positive for headaches. Negative for syncope, weakness and numbness.       +phonophobia  All other systems reviewed and are negative.    Physical Exam Updated Vital  Signs BP 120/78 (BP Location: Right Arm)   Pulse 79   Temp 98.3 F (36.8 C)   Resp 16   Ht 5\' 8"  (1.727 m)   Wt 108.9 kg (240 lb 1.3 oz)   LMP 11/08/2017   SpO2 100%   BMI 36.50 kg/m   Physical Exam  Constitutional: She is oriented to person, place, and time. She appears well-developed and well-nourished. No distress.  Sitting upright in bed, appears uncomfortable  HENT:  Head: Normocephalic and atraumatic.  Eyes: Conjunctivae and EOM are normal. Pupils are equal, round, and reactive to light. Right eye exhibits no discharge. Left eye exhibits no discharge. Right  eye exhibits no nystagmus. Left eye exhibits no nystagmus.  Neck: Normal range of motion. Neck supple. No JVD present. No neck rigidity. No tracheal deviation present. No Brudzinski's sign and no Kernig's sign noted.  Cardiovascular: Normal rate, regular rhythm, normal heart sounds and intact distal pulses.  Pulmonary/Chest: Effort normal and breath sounds normal.  Abdominal: Soft. Bowel sounds are normal. She exhibits no distension. There is no tenderness.  Musculoskeletal: Normal range of motion. She exhibits no edema or tenderness.  5/5 strength of BUE and BLE major muscle groups. No midline spine TTP, no paraspinal muscle tenderness, no deformity, crepitus, or step-off noted   Neurological: She is alert and oriented to person, place, and time. She has normal strength. No cranial nerve deficit. She displays a negative Romberg sign. Gait normal. GCS eye subscore is 4. GCS verbal subscore is 5. GCS motor subscore is 6.  Skin: Skin is warm and dry. No erythema.  Psychiatric: She has a normal mood and affect. Her behavior is normal.  Nursing note and vitals reviewed.    ED Treatments / Results  Labs (all labs ordered are listed, but only abnormal results are displayed) Labs Reviewed - No data to display  EKG  EKG Interpretation None       Radiology No results found.  Procedures Procedures (including critical care time)  Medications Ordered in ED Medications  sodium chloride 0.9 % bolus 1,000 mL (0 mLs Intravenous Stopped 11/10/17 1218)  ketorolac (TORADOL) 30 MG/ML injection 30 mg (30 mg Intravenous Given 11/10/17 1124)  metoCLOPramide (REGLAN) injection 10 mg (10 mg Intravenous Given 11/10/17 1125)  diphenhydrAMINE (BENADRYL) injection 25 mg (25 mg Intravenous Given 11/10/17 1125)     Initial Impression / Assessment and Plan / ED Course  I have reviewed the triage vital signs and the nursing notes.  Pertinent labs & imaging results that were available during my care of the  patient were reviewed by me and considered in my medical decision making (see chart for details).     Patient with history of migraine headaches.  Afebrile, vital signs are stable.  No focal neurologic deficits on examination.  She is uncomfortable but nontoxic in appearance.  Pt HA treated and improved while in ED.  Presentation is like pts typical migraine HA and non concerning [facility], ICH, Meningitis, or temporal arteritis. Pt is afebrile with no focal neuro deficits, nuchal rigidity, or change in vision. Pt is to follow up with PCP to discuss prophylactic medication.  Discussed indications for return to the ED. Pt verbalized understanding of and agreement with plan and is safe for discharge home at this time.   Final Clinical Impressions(s) / ED Diagnoses   Final diagnoses:  Bad headache    ED Discharge Orders    None       Jeanie SewerFawze, Salam Micucci A, PA-C 11/10/17  4098    Alvira Monday, MD 11/10/17 2108

## 2018-01-22 ENCOUNTER — Encounter (HOSPITAL_BASED_OUTPATIENT_CLINIC_OR_DEPARTMENT_OTHER): Payer: Self-pay | Admitting: Emergency Medicine

## 2018-01-22 ENCOUNTER — Emergency Department (HOSPITAL_BASED_OUTPATIENT_CLINIC_OR_DEPARTMENT_OTHER)
Admission: EM | Admit: 2018-01-22 | Discharge: 2018-01-22 | Disposition: A | Discharge status: Home / Self Care | Payer: Self-pay | Attending: Emergency Medicine | Admitting: Emergency Medicine

## 2018-01-22 ENCOUNTER — Other Ambulatory Visit: Payer: Self-pay

## 2018-01-22 DIAGNOSIS — L739 Follicular disorder, unspecified: Secondary | ICD-10-CM | POA: Insufficient documentation

## 2018-01-22 DIAGNOSIS — E119 Type 2 diabetes mellitus without complications: Secondary | ICD-10-CM | POA: Insufficient documentation

## 2018-01-22 DIAGNOSIS — F17210 Nicotine dependence, cigarettes, uncomplicated: Secondary | ICD-10-CM | POA: Insufficient documentation

## 2018-01-22 DIAGNOSIS — Z9049 Acquired absence of other specified parts of digestive tract: Secondary | ICD-10-CM | POA: Insufficient documentation

## 2018-01-22 DIAGNOSIS — Z7984 Long term (current) use of oral hypoglycemic drugs: Secondary | ICD-10-CM | POA: Insufficient documentation

## 2018-01-22 DIAGNOSIS — I10 Essential (primary) hypertension: Secondary | ICD-10-CM | POA: Insufficient documentation

## 2018-01-22 DIAGNOSIS — Z23 Encounter for immunization: Secondary | ICD-10-CM | POA: Insufficient documentation

## 2018-01-22 MED ORDER — IBUPROFEN 800 MG PO TABS: 800.0000 mg | ORAL_TABLET | Freq: Three times a day (TID) | ORAL | 0 refills | Status: DC | PRN

## 2018-01-22 MED ORDER — TETANUS-DIPHTH-ACELL PERTUSSIS 5-2.5-18.5 LF-MCG/0.5 IM SUSP
0.5000 mL | Freq: Once | INTRAMUSCULAR | Status: AC
  Administered 2018-01-22: 0.5 mL via INTRAMUSCULAR
  Filled 2018-01-22: qty 0.5

## 2018-01-22 MED ORDER — CEPHALEXIN 500 MG PO CAPS: 500.0000 mg | ORAL_CAPSULE | Freq: Three times a day (TID) | ORAL | 0 refills | Status: DC

## 2018-01-22 NOTE — ED Provider Notes (Signed)
[facility] EMERGENCY DEPARTMENT Provider Note   CSN: [id number] Arrival date & time: 01/22/18  1128     History   Chief Complaint Chief Complaint  Patient presents with  . Abscess    HPI [name] is a 27 y.o. female.  HPI   27 year old female presenting for evaluation of skin infection.  Patient report approximately a week ago she noticed a small bump on her left forearm.  2 days ago her boyfriend try to pop the area and since then it has been very painful.  Pain is described as just throbbing stabbing sensation 8 out of 10.  She denies any specific treatment.  She does use occasional warm compress.  She denies having fever or chills, unable to recall last tetanus status.  She does not have insurance so therefore she has not tried any treatment.  She does have history of diabetes and hypertension.  She is unable to recall last tetanus status.  No history of IV drug use.    Past Medical History:  Diagnosis Date  . Diabetes mellitus without complication (HCC)   . Hypertension    age 27  . Migraine   . Morbid obesity (HCC)   . Ovarian cyst     Patient Active Problem List   Diagnosis Date Noted  . SIRS (systemic inflammatory response syndrome) (HCC) 04/06/2015  . Hyperglycemia 04/06/2015  . Nausea and vomiting 04/06/2015  . Migraine 04/06/2015  . Essential hypertension 04/06/2015  . Syncope 04/06/2015    Past Surgical History:  Procedure Laterality Date  . CHOLECYSTECTOMY    . TUBES REMOVED       OB History   None      Home Medications    Prior to Admission medications   Medication Sig Start Date End Date Taking? Authorizing Provider  HYDROcodone-acetaminophen (NORCO) 5-325 MG tablet Take 1 tablet by mouth every 6 (six) hours as needed (for pain). 04/06/17   Molpus, John, MD  ibuprofen (ADVIL,MOTRIN) 800 MG tablet Take 1 tablet (800 mg total) by mouth 3 (three) times daily. 08/05/16   Long, Arlyss RepressJoshua G, MD  metFORMIN (GLUCOPHAGE) 500 MG  tablet Take 1 tablet (500 mg total) by mouth 2 (two) times daily with a meal. 04/29/15   Mabe, Latanya MaudlinMartha L, MD  ondansetron (ZOFRAN) 4 MG tablet Take 1 tablet (4 mg total) by mouth every 8 (eight) hours as needed for nausea or vomiting. 04/18/17   Arthor CaptainHarris, Abigail, PA-C    Family History Family History  Problem Relation Age of Onset  . Hypertension Mother     Social History Social History   Tobacco Use  . Smoking status: Current Some Day Smoker    Packs/day: 3.00    Types: Cigarettes  . Smokeless tobacco: Never Used  Substance Use Topics  . Alcohol use: Yes    Comment: occassionally  . Drug use: No     Allergies   Patient has no known allergies.   Review of Systems Review of Systems  Constitutional: Negative for fever.  Skin: Positive for wound. Negative for rash.  Neurological: Negative for numbness.     Physical Exam Updated Vital Signs BP (!) 134/92 (BP Location: Right Arm)   Pulse 91   Temp 98.1 F (36.7 C) (Oral)   Resp 16   Ht 5\' 8"  (1.727 m)   Wt 108.9 kg (240 lb)   LMP 12/27/2017   SpO2 97%   BMI 36.49 kg/m   Physical Exam  Constitutional: She appears well-developed  and well-nourished. No distress.  Obese female nontoxic in appearance  HENT:  Head: Atraumatic.  Eyes: Conjunctivae are normal.  Neck: Neck supple.  Musculoskeletal: She exhibits tenderness (Left forearm: An area of very mild induration approximately 3 mm in size with a small scab but no surrounding skin erythema and no fluctuant were noted to the dorsum of the mid forearm.  No track marks.).  Neurological: She is alert.  Skin: No rash noted.  Psychiatric: She has a normal mood and affect.  Nursing note and vitals reviewed.    ED Treatments / Results  Labs (all labs ordered are listed, but only abnormal results are displayed) Labs Reviewed - No data to display  EKG None  Radiology No results found.  Procedures Procedures (including critical care time)  Medications Ordered in  ED Medications - No data to display   Initial Impression / Assessment and Plan / ED Course  I have reviewed the triage vital signs and the nursing notes.  Pertinent labs & imaging results that were available during my care of the patient were reviewed by me and considered in my medical decision making (see chart for details).     BP (!) 134/92 (BP Location: Right Arm)   Pulse 91   Temp 98.1 F (36.7 C) (Oral)   Resp 16   Ht 5\' 8"  (1.727 m)   Wt 108.9 kg (240 lb)   LMP 12/27/2017   SpO2 97%   BMI 36.49 kg/m    Final Clinical Impressions(s) / ED Diagnoses   Final diagnoses:  Folliculitis    ED Discharge Orders        Ordered    ibuprofen (ADVIL,MOTRIN) 800 MG tablet  Every 8 hours PRN     01/22/18 1520    cephALEXin (KEFLEX) 500 MG capsule  3 times daily     01/22/18 1520     3:18 PM Patient with a lesion to her left forearm likely an infected pimple without evidence of abscess or surrounding skin cellulitis.  It is tender therefore patient will be treated with antibiotic.  Recommend return if she noticed worsening symptom.  At this time no obvious abscess amenable for drainage.  Will update her tetanus status.   Fayrene Helper, PA-C 01/22/18 1520    Tilden Fossa, MD 01/23/18 0130

## 2018-01-22 NOTE — ED Triage Notes (Signed)
Possible abscess to L arm.

## 2018-01-22 NOTE — Discharge Instructions (Addendum)
Please take antibiotic as prescribed for the full duration. Apply warm compress to affected area several times daily.  Return if your condition worsen or if you have other concerns.

## 2018-02-10 ENCOUNTER — Encounter (HOSPITAL_BASED_OUTPATIENT_CLINIC_OR_DEPARTMENT_OTHER): Payer: Self-pay | Admitting: *Deleted

## 2018-02-10 ENCOUNTER — Other Ambulatory Visit: Payer: Self-pay

## 2018-02-10 ENCOUNTER — Emergency Department (HOSPITAL_BASED_OUTPATIENT_CLINIC_OR_DEPARTMENT_OTHER)
Admission: EM | Admit: 2018-02-10 | Discharge: 2018-02-10 | Disposition: A | Discharge status: Home / Self Care | Payer: Self-pay | Attending: Emergency Medicine | Admitting: Emergency Medicine

## 2018-02-10 DIAGNOSIS — F419 Anxiety disorder, unspecified: Secondary | ICD-10-CM | POA: Insufficient documentation

## 2018-02-10 DIAGNOSIS — I10 Essential (primary) hypertension: Secondary | ICD-10-CM | POA: Insufficient documentation

## 2018-02-10 DIAGNOSIS — E119 Type 2 diabetes mellitus without complications: Secondary | ICD-10-CM | POA: Insufficient documentation

## 2018-02-10 DIAGNOSIS — R1110 Vomiting, unspecified: Secondary | ICD-10-CM | POA: Insufficient documentation

## 2018-02-10 DIAGNOSIS — F17210 Nicotine dependence, cigarettes, uncomplicated: Secondary | ICD-10-CM | POA: Insufficient documentation

## 2018-02-10 DIAGNOSIS — Z7984 Long term (current) use of oral hypoglycemic drugs: Secondary | ICD-10-CM | POA: Insufficient documentation

## 2018-02-10 DIAGNOSIS — R0789 Other chest pain: Secondary | ICD-10-CM | POA: Insufficient documentation

## 2018-02-10 LAB — CBG MONITORING, ED: Glucose-Capillary: 213 mg/dL — ABNORMAL HIGH (ref 65–99)

## 2018-02-10 MED ORDER — HYDROXYZINE HCL 25 MG PO TABS: 25.0000 mg | ORAL_TABLET | Freq: Four times a day (QID) | ORAL | 0 refills | Status: DC

## 2018-02-10 MED FILL — hydrOXYzine HCL 25 MG TABS: 25 | 3 days supply | Qty: 12 | Fill #0

## 2018-02-10 NOTE — ED Notes (Signed)
ED Provider at bedside. 

## 2018-02-10 NOTE — ED Provider Notes (Signed)
[facility] EMERGENCY DEPARTMENT Provider Note   CSN: [id number] Arrival date & time: 02/10/18  1341     History   Chief Complaint Chief Complaint  Patient presents with  . Panic Attack    HPI [name] is a 27 y.o. female.  HPI  [name] is a 27 y.o. female, with a history of DM, HTN, migraines, anxiety, and morbid obesity, presenting to the ED with complaint of anxiety and "panic attack" beginning just prior to arrival.  Endorses chest tightness. States she was at work, began to feel anxious, vomited, and began to hyperventilate when she thought she saw drops of blood in the vomit.  States this is consistent with previous anxiety and panic attacks.  States she started a new job 3 weeks ago after being out of work for several months, has been having frustrations adjusting to the job, and is embarrassed that she had a panic attack in front of her coworkers. States her symptoms will start to improve, she begins to think about her job and her frustrations, and her symptoms resume. Denies current nausea/vomiting, recent illness, abdominal pain, or any other complaints.   Past Medical History:  Diagnosis Date  . Diabetes mellitus without complication (HCC)   . Hypertension    age 60  . Migraine   . Morbid obesity (HCC)   . Ovarian cyst     Patient Active Problem List   Diagnosis Date Noted  . SIRS (systemic inflammatory response syndrome) (HCC) 04/06/2015  . Hyperglycemia 04/06/2015  . Nausea and vomiting 04/06/2015  . Migraine 04/06/2015  . Essential hypertension 04/06/2015  . Syncope 04/06/2015    Past Surgical History:  Procedure Laterality Date  . CHOLECYSTECTOMY    . TUBES REMOVED       OB History   None      Home Medications    Prior to Admission medications   Medication Sig Start Date End Date Taking? Authorizing Provider  cephALEXin (KEFLEX) 500 MG capsule Take 1 capsule (500 mg total) by mouth 3 (three) times daily. 2  caps po bid x 7 days 01/22/18   Fayrene Helper, PA-C  hydrOXYzine (ATARAX/VISTARIL) 25 MG tablet Take 1 tablet (25 mg total) by mouth every 6 (six) hours. 02/10/18   Joy, Shawn C, PA-C  ibuprofen (ADVIL,MOTRIN) 800 MG tablet Take 1 tablet (800 mg total) by mouth every 8 (eight) hours as needed for moderate pain. 01/22/18   Fayrene Helper, PA-C  metFORMIN (GLUCOPHAGE) 500 MG tablet Take 1 tablet (500 mg total) by mouth 2 (two) times daily with a meal. 04/29/15   Mabe, Latanya Maudlin, MD    Family History Family History  Problem Relation Age of Onset  . Hypertension Mother     Social History Social History   Tobacco Use  . Smoking status: Current Some Day Smoker    Packs/day: 3.00    Types: Cigarettes  . Smokeless tobacco: Never Used  Substance Use Topics  . Alcohol use: Yes    Comment: occassionally  . Drug use: No     Allergies   Patient has no known allergies.   Review of Systems Review of Systems  Constitutional: Negative for chills, diaphoresis and fever.  Respiratory: Negative for cough and shortness of breath.   Cardiovascular:       Chest tightness  Gastrointestinal: Positive for vomiting. Negative for abdominal pain and diarrhea.  Neurological: Negative for dizziness, syncope, light-headedness and headaches.  Psychiatric/Behavioral: The patient is nervous/anxious.   All other systems  reviewed and are negative.    Physical Exam Updated Vital Signs BP (!) 126/93 (BP Location: Right Arm)   Pulse 94   Temp 98 F (36.7 C) (Oral)   Resp 20   Ht  (1.727 m)   Wt 109.8 kg (242 lb)   LMP 02/01/2018   SpO2 100%   BMI 36.80 kg/m   Physical Exam  Constitutional: She appears well-developed and well-nourished. No distress.  HENT:  Head: Normocephalic and atraumatic.  Eyes: Conjunctivae are normal.  Neck: Neck supple.  Cardiovascular: Normal rate, regular rhythm, normal heart sounds and intact distal pulses.  Pulmonary/Chest: Effort normal and breath sounds normal. No  respiratory distress.  Abdominal: Soft. There is no tenderness. There is no guarding.  Musculoskeletal: She exhibits no edema.  Lymphadenopathy:    She has no cervical adenopathy.  Neurological: She is alert.  Skin: Skin is warm and dry. She is not diaphoretic.  Psychiatric: Her behavior is normal. Her mood appears anxious.  Patient initially with regular respiratory rate and normal appearing presentation.  When she began to talk about her job and the panic attack she experienced while at work, she began to hyperventilate and anxious appearing.  Nursing note and vitals reviewed.    ED Treatments / Results  Labs (all labs ordered are listed, but only abnormal results are displayed) Labs Reviewed  CBG MONITORING, ED - Abnormal; Notable for the following components:      Result Value   Glucose-Capillary 213 (*)    All other components within normal limits    EKG None  Radiology No results found.  Procedures Procedures (including critical care time)  Medications Ordered in ED Medications - No data to display   Initial Impression / Assessment and Plan / ED Course  I have reviewed the triage vital signs and the nursing notes.  Pertinent labs & imaging results that were available during my care of the patient were reviewed by me and considered in my medical decision making (see chart for details).     Patient presents with what she notes as anxiety and panic attacks.  She was able to be calmed with verbal techniques and conversation.  Her symptoms and complaints resolved.  PCP follow-up.  Resources given. The patient was given instructions for home care as well as return precautions. Patient voices understanding of these instructions, accepts the plan, and is comfortable with discharge.   Final Clinical Impressions(s) / ED Diagnoses   Final diagnoses:  Anxiety    ED Discharge Orders        Ordered    hydrOXYzine (ATARAX/VISTARIL) 25 MG tablet  Every 6 hours      02/10/18 1500       Anselm Pancoast, PA-C 02/10/18 1550    Maia Plan, MD 02/11/18 [phone]

## 2018-02-10 NOTE — ED Triage Notes (Signed)
To ED via EMS with c.o panic attack at work today. States she has felt anxious for the past 3 days due to work related issues. She vomited dark colored gastric contents at work before EMS was called.

## 2018-02-10 NOTE — Discharge Instructions (Signed)
Please continue to use the coping techniques discussed during your visit.  Follow-up with a primary care provider.  Call the number provided to set up an appointment.  May also use the resource guides at the back of this discharge packet. May use the hydroxyzine, as needed, for anxiety.  Use caution as the hydroxyzine can cause drowsiness.

## 2018-02-10 NOTE — ED Notes (Signed)
Pt.   Speaking with PA C Joy about her issues and why she is here.  Pt. Said she is here due to panic attacks and she can't handle stress at her job.  Pt. Said she has issues with things that don't go right in life.  Pt. Has frustration issues.  Pt. Talks continuously about her issues and is able to reach all of her problems and talk about things. But she is unable to deal with life issues.  Pt. Is not suicidal and is not homicidal.  Pt. Said her husband makes her happy.

## 2018-05-15 MED FILL — BACLOFEN 10 MG TABLET: 10 | 7 days supply | Qty: 21 | Fill #0

## 2018-05-15 MED FILL — NAPROXEN 500 MG TABLET: 500 | 7 days supply | Qty: 14 | Fill #0

## 2018-12-09 ENCOUNTER — Encounter (HOSPITAL_BASED_OUTPATIENT_CLINIC_OR_DEPARTMENT_OTHER): Payer: Self-pay

## 2018-12-09 ENCOUNTER — Emergency Department (HOSPITAL_BASED_OUTPATIENT_CLINIC_OR_DEPARTMENT_OTHER): Payer: Self-pay

## 2018-12-09 ENCOUNTER — Other Ambulatory Visit: Payer: Self-pay

## 2018-12-09 ENCOUNTER — Emergency Department (HOSPITAL_BASED_OUTPATIENT_CLINIC_OR_DEPARTMENT_OTHER)
Admission: EM | Admit: 2018-12-09 | Discharge: 2018-12-09 | Disposition: A | Discharge status: Home / Self Care | Payer: Self-pay | Attending: Emergency Medicine | Admitting: Emergency Medicine

## 2018-12-09 DIAGNOSIS — Y929 Unspecified place or not applicable: Secondary | ICD-10-CM | POA: Insufficient documentation

## 2018-12-09 DIAGNOSIS — Y999 Unspecified external cause status: Secondary | ICD-10-CM | POA: Insufficient documentation

## 2018-12-09 DIAGNOSIS — W228XXA Striking against or struck by other objects, initial encounter: Secondary | ICD-10-CM | POA: Insufficient documentation

## 2018-12-09 DIAGNOSIS — Y939 Activity, unspecified: Secondary | ICD-10-CM | POA: Insufficient documentation

## 2018-12-09 DIAGNOSIS — S99921A Unspecified injury of right foot, initial encounter: Secondary | ICD-10-CM

## 2018-12-09 DIAGNOSIS — S92514A Nondisplaced fracture of proximal phalanx of right lesser toe(s), initial encounter for closed fracture: Secondary | ICD-10-CM | POA: Insufficient documentation

## 2018-12-09 NOTE — Discharge Instructions (Addendum)
You have been diagnosed today with right foot pain.  At this time there does not appear to be the presence of an emergent medical condition, however there is always the potential for conditions to change. Please read and follow the below instructions.  Please return to the Emergency Department immediately for any new or worsening symptoms. Please be sure to follow up with your Primary Care Provider within one week regarding your visit today; please call their office to schedule an appointment even if you are feeling better for a follow-up visit. Despite your x-ray today being read as negative there is still possibility that small fractures may be present in your foot.  Ligamentous and tendon injury may also be present.  I strongly recommend that you follow-up with the orthopedic specialist on your discharge paperwork for further evaluation of your foot pain/injury.  Please use the crutches provided today to keep weight off of your foot to help with your pain.  You may continue buddy taping your toes to protect them and use the postop shoe. Please use rest, ice and elevation to help with your pain and swelling. You may use the over the counter medication Tylenol as directed on the packaging to help with your pain.  Get help right away if: You lose feeling (have numbness) in your toe or foot, and it is getting worse. Your toe or your foot tingles. Your toe or your foot gets cold or turns blue. You have redness or swelling in your toe or foot, and it is getting worse. You have very bad pain. Any new or worsening symptoms  Please read the additional information packets attached to your discharge summary.

## 2018-12-09 NOTE — ED Notes (Signed)
Patient transported to X-ray 

## 2018-12-09 NOTE — ED Notes (Signed)
Pt has crutches at home she plans to use.

## 2018-12-09 NOTE — ED Provider Notes (Signed)
[facility] EMERGENCY DEPARTMENT Provider Note   CSN: [id number] Arrival date & time: 12/09/18  1142    History   Chief Complaint Chief Complaint  Patient presents with  . Foot Pain    HPI [name] is a 28 y.o. female presenting for right foot pain and swelling.  Patient reports that at 5:30 PM yesterday she dropped a heavy canopy onto her right foot.  Patient states that she has had increased pain and swelling since that time with some bruising.  Patient symptoms are concentrated on her fourth and fifth toes of her right foot.  Patient describes her pain as a moderate intensity constant throbbing pain worsened with ambulation and improved with rest and elevation.  Patient is also taken ibuprofen for her pain with some relief.  Patient denies numbness, tingling or weakness, she denies pain at the ankle or knee.  She denies any other injury or concern.     HPI  Past Medical History:  Diagnosis Date  . Diabetes mellitus without complication (HCC)   . Hypertension    age 28  . Migraine   . Morbid obesity (HCC)   . Ovarian cyst     Patient Active Problem List   Diagnosis Date Noted  . SIRS (systemic inflammatory response syndrome) (HCC) 04/06/2015  . Hyperglycemia 04/06/2015  . Nausea and vomiting 04/06/2015  . Migraine 04/06/2015  . Essential hypertension 04/06/2015  . Syncope 04/06/2015    Past Surgical History:  Procedure Laterality Date  . CHOLECYSTECTOMY    . TUBES REMOVED       OB History   No obstetric history on file.      Home Medications    Prior to Admission medications   Medication Sig Start Date End Date Taking? Authorizing Provider  cephALEXin (KEFLEX) 500 MG capsule Take 1 capsule (500 mg total) by mouth 3 (three) times daily. 2 caps po bid x 7 days 01/22/18   Fayrene Helper, PA-C  hydrOXYzine (ATARAX/VISTARIL) 25 MG tablet Take 1 tablet (25 mg total) by mouth every 6 (six) hours. 02/10/18   Joy, Shawn C, PA-C  ibuprofen  (ADVIL,MOTRIN) 800 MG tablet Take 1 tablet (800 mg total) by mouth every 8 (eight) hours as needed for moderate pain. 01/22/18   Fayrene Helper, PA-C  metFORMIN (GLUCOPHAGE) 500 MG tablet Take 1 tablet (500 mg total) by mouth 2 (two) times daily with a meal. 04/29/15   Mabe, Latanya Maudlin, MD    Family History Family History  Problem Relation Age of Onset  . Hypertension Mother     Social History Social History   Tobacco Use  . Smoking status: Current Some Day Smoker    Packs/day: 3.00    Types: Cigarettes  . Smokeless tobacco: Never Used  Substance Use Topics  . Alcohol use: Yes    Comment: occassionally  . Drug use: No     Allergies   Patient has no known allergies.   Review of Systems Review of Systems  Constitutional: Negative.  Negative for chills and fever.  Musculoskeletal: Positive for arthralgias (Right fourth/fifth toes) and joint swelling. Negative for back pain and neck pain.  Skin: Positive for color change (Bruising). Negative for wound.  Neurological: Negative.  Negative for dizziness, syncope, weakness, light-headedness, numbness and headaches.   Physical Exam Updated Vital Signs BP (!) 155/91 (BP Location: Left Arm)   Pulse 88   Temp 98 F (36.7 C) (Oral)   Resp 18   Ht 5\' 8"  (1.727 m)  Wt 109.8 kg   LMP 11/13/2018   SpO2 100%   BMI 36.80 kg/m   Physical Exam Constitutional:      General: She is not in acute distress.    Appearance: Normal appearance. She is well-developed. She is obese. She is not ill-appearing or diaphoretic.  HENT:     Head: Normocephalic and atraumatic.     Right Ear: External ear normal.     Left Ear: External ear normal.     Nose: Nose normal.  Eyes:     General: Vision grossly intact. Gaze aligned appropriately.     Pupils: Pupils are equal, round, and reactive to light.  Neck:     Musculoskeletal: Normal range of motion.     Trachea: Trachea and phonation normal. No tracheal deviation.  Cardiovascular:     Rate and  Rhythm: Normal rate and regular rhythm.     Pulses: Normal pulses.          Dorsalis pedis pulses are 2+ on the right side and 2+ on the left side.       Posterior tibial pulses are 2+ on the right side and 2+ on the left side.  Pulmonary:     Effort: Pulmonary effort is normal. No respiratory distress.  Musculoskeletal: Normal range of motion.     Right knee: Normal.     Left knee: Normal.     Right ankle: Normal.     Left ankle: Normal.     Right lower leg: Normal.     Left lower leg: Normal.     Right foot: Normal capillary refill. Tenderness and swelling present. No crepitus or deformity.     Left foot: Normal.       Feet:  Feet:     Right foot:     Protective Sensation: 5 sites tested. 5 sites sensed.     Skin integrity: Skin integrity normal.     Left foot:     Protective Sensation: 5 sites tested. 5 sites sensed.     Skin integrity: Skin integrity normal.     Comments: Right foot: Patient with mild amount of swelling and bruising around right fifth MTP.  Capillary refill intact to all toes.  Sensation intact to all toes.  Pedal pulses equal bilaterally.  Compartments soft to palpation.  No breaks in skin seen.  Patient with full range of motion at the knee, ankle.  Patient with full range of motion however with increased pain at the toes. Skin:    General: Skin is warm and dry.     Capillary Refill: Capillary refill takes less than 2 seconds.  Neurological:     Mental Status: She is alert.     GCS: GCS eye subscore is 4. GCS verbal subscore is 5. GCS motor subscore is 6.     Comments: Speech is clear and goal oriented, follows commands Major Cranial nerves without deficit, no facial droop Moves extremities without ataxia, coordination intact  Psychiatric:        Behavior: Behavior normal.    ED Treatments / Results  Labs (all labs ordered are listed, but only abnormal results are displayed) Labs Reviewed - No data to display  EKG None  Radiology Dg Foot  Complete Right  Result Date: 12/09/2018 CLINICAL DATA:  Right foot pain and swelling. EXAM: RIGHT FOOT COMPLETE - 3+ VIEW COMPARISON:  None. FINDINGS: There is no evidence of fracture or dislocation. There is no evidence of arthropathy or other focal bone abnormality. Soft tissues  are unremarkable. IMPRESSION: Negative. Electronically Signed   By: Elige Ko   On: 12/09/2018 12:21    Procedures Procedures (including critical care time)  Medications Ordered in ED Medications - No data to display   Initial Impression / Assessment and Plan / ED Course  I have reviewed the triage vital signs and the nursing notes.  Pertinent labs & imaging results that were available during my care of the patient were reviewed by me and considered in my medical decision making (see chart for details).    28 year old female presenting for right fourth and fifth toe pain with mild amount of swelling and ecchymosis after dropping object on her toe yesterday afternoon.  Bilateral lower extremities neurovascularly intact; no signs of infection, septic joint, DVT, compartment syndrome.  Patient with increased pain with range of motion of the fourth/fifth toes of the right foot.  Otherwise patient with full range of motion and 5/5 strength to all movements of bilateral lower extremities.  DG right foot: IMPRESSION: Negative.  - I have personally reviewed imaging of patient's right foot, question very small fractures at fourth/fifth proximal interphalangeal joints, medial sides.  These clinically correlate to patient's area of pain and swelling.  Discussed with Dr. Criss Alvine; even with small fracture we would still treat the same.  Plan to buddy tape patient's toes, provide postop shoe and crutches.  Orthopedic referral given and patient encouraged to schedule follow-up.  Patient informed that ligamentous, tendon injury may be present and that orthopedic follow-up is recommended.  Patient informed of possibility of  small fractures today and orthopedic follow-up is recommended.  Patient updated on plan of care and concern for small fracture today and she is agreeable with discharge, buddy taping, postop shoe and crutches with orthopedic follow-up.  At this time there does not appear to be any evidence of an acute emergency medical condition and the patient appears stable for discharge with appropriate outpatient follow up. Diagnosis was discussed with patient who verbalizes understanding of care plan and is agreeable to discharge. I have discussed return precautions with patient and family who verbalize understanding of return precautions. Patient strongly encouraged to follow-up with their PCP and ortho. All questions answered.  Patient's case discussed with Dr. Criss Alvine who agrees with plan to discharge with ortho follow-up.   Note: Portions of this report may have been transcribed using voice recognition software. Every effort was made to ensure accuracy; however, inadvertent computerized transcription errors may still be present. Final Clinical Impressions(s) / ED Diagnoses   Final diagnoses:  Injury of right foot, initial encounter  Closed nondisplaced fracture of proximal phalanx of lesser toe of right foot, initial encounter    ED Discharge Orders    None       Elizabeth Palau 12/09/18 1317    Pricilla Loveless, MD 12/09/18 1408

## 2018-12-09 NOTE — ED Triage Notes (Signed)
Right foot pain and swelling after dropping canopy on foot.

## 2018-12-09 NOTE — ED Notes (Signed)
Pt enrolled in aromatherapy pain trial 

## 2019-06-26 ENCOUNTER — Other Ambulatory Visit: Payer: Self-pay

## 2019-06-26 ENCOUNTER — Emergency Department (HOSPITAL_COMMUNITY): Payer: Self-pay | Admitting: Certified Registered Nurse Anesthetist

## 2019-06-26 ENCOUNTER — Encounter (HOSPITAL_COMMUNITY)
Admission: EM | Disposition: A | Discharge status: Home / Self Care | Payer: Self-pay | Source: Home / Self Care | Attending: Neurosurgery

## 2019-06-26 ENCOUNTER — Inpatient Hospital Stay (HOSPITAL_BASED_OUTPATIENT_CLINIC_OR_DEPARTMENT_OTHER)
Admission: EM | Admit: 2019-06-26 | Discharge: 2019-06-27 | DRG: 520 | Disposition: A | Discharge status: Home / Self Care | Payer: Self-pay | Attending: Neurosurgery | Admitting: Neurosurgery

## 2019-06-26 ENCOUNTER — Emergency Department (HOSPITAL_COMMUNITY): Payer: Self-pay

## 2019-06-26 ENCOUNTER — Emergency Department (HOSPITAL_BASED_OUTPATIENT_CLINIC_OR_DEPARTMENT_OTHER): Payer: Self-pay

## 2019-06-26 ENCOUNTER — Encounter (HOSPITAL_BASED_OUTPATIENT_CLINIC_OR_DEPARTMENT_OTHER): Payer: Self-pay | Admitting: Emergency Medicine

## 2019-06-26 DIAGNOSIS — Z7984 Long term (current) use of oral hypoglycemic drugs: Secondary | ICD-10-CM

## 2019-06-26 DIAGNOSIS — I10 Essential (primary) hypertension: Secondary | ICD-10-CM | POA: Diagnosis present

## 2019-06-26 DIAGNOSIS — M5104 Intervertebral disc disorders with myelopathy, thoracic region: Secondary | ICD-10-CM | POA: Diagnosis present

## 2019-06-26 DIAGNOSIS — M5105 Intervertebral disc disorders with myelopathy, thoracolumbar region: Principal | ICD-10-CM | POA: Diagnosis present

## 2019-06-26 DIAGNOSIS — M79651 Pain in right thigh: Secondary | ICD-10-CM

## 2019-06-26 DIAGNOSIS — Z8249 Family history of ischemic heart disease and other diseases of the circulatory system: Secondary | ICD-10-CM

## 2019-06-26 DIAGNOSIS — Z6837 Body mass index (BMI) 37.0-37.9, adult: Secondary | ICD-10-CM

## 2019-06-26 DIAGNOSIS — Z79899 Other long term (current) drug therapy: Secondary | ICD-10-CM

## 2019-06-26 DIAGNOSIS — E119 Type 2 diabetes mellitus without complications: Secondary | ICD-10-CM | POA: Diagnosis present

## 2019-06-26 DIAGNOSIS — F17210 Nicotine dependence, cigarettes, uncomplicated: Secondary | ICD-10-CM | POA: Diagnosis present

## 2019-06-26 DIAGNOSIS — Z419 Encounter for procedure for purposes other than remedying health state, unspecified: Secondary | ICD-10-CM

## 2019-06-26 DIAGNOSIS — Z20828 Contact with and (suspected) exposure to other viral communicable diseases: Secondary | ICD-10-CM | POA: Diagnosis present

## 2019-06-26 DIAGNOSIS — R200 Anesthesia of skin: Secondary | ICD-10-CM

## 2019-06-26 HISTORY — PX: LUMBAR LAMINECTOMY/DECOMPRESSION MICRODISCECTOMY: SHX5026

## 2019-06-26 LAB — CK: Total CK: 114 U/L (ref 38–234)

## 2019-06-26 LAB — CBC WITH DIFFERENTIAL/PLATELET
Abs Immature Granulocytes: 0.03 10*3/uL (ref 0.00–0.07)
Basophils Absolute: 0 10*3/uL (ref 0.0–0.1)
Basophils Relative: 0 %
Eosinophils Absolute: 0.3 10*3/uL (ref 0.0–0.5)
Eosinophils Relative: 3 %
HCT: 37.6 % (ref 36.0–46.0)
Hemoglobin: 12.6 g/dL (ref 12.0–15.0)
Immature Granulocytes: 0 %
Lymphocytes Relative: 24 %
Lymphs Abs: 2.5 10*3/uL (ref 0.7–4.0)
MCH: 29.7 pg (ref 26.0–34.0)
MCHC: 33.5 g/dL (ref 30.0–36.0)
MCV: 88.7 fL (ref 80.0–100.0)
Monocytes Absolute: 0.6 10*3/uL (ref 0.1–1.0)
Monocytes Relative: 6 %
Neutro Abs: 6.7 10*3/uL (ref 1.7–7.7)
Neutrophils Relative %: 67 %
Platelets: 214 10*3/uL (ref 150–400)
RBC: 4.24 MIL/uL (ref 3.87–5.11)
RDW: 12.6 % (ref 11.5–15.5)
WBC: 10.1 10*3/uL (ref 4.0–10.5)
nRBC: 0 % (ref 0.0–0.2)

## 2019-06-26 LAB — BASIC METABOLIC PANEL
Anion gap: 9 (ref 5–15)
BUN: 7 mg/dL (ref 6–20)
CO2: 26 mmol/L (ref 22–32)
Calcium: 8.5 mg/dL — ABNORMAL LOW (ref 8.9–10.3)
Chloride: 102 mmol/L (ref 98–111)
Creatinine, Ser: 0.56 mg/dL (ref 0.44–1.00)
GFR calc Af Amer: >60 mL/min (ref >60)
GFR calc non Af Amer: >60 mL/min (ref >60)
Glucose, Bld: 171 mg/dL — ABNORMAL HIGH (ref 70–99)
Potassium: 3.5 mmol/L (ref 3.5–5.1)
Sodium: 137 mmol/L (ref 135–145)

## 2019-06-26 LAB — URINALYSIS, MICROSCOPIC (REFLEX): WBC, UA: >50 WBC/hpf (ref 0–5)

## 2019-06-26 LAB — GLUCOSE, CAPILLARY
Glucose-Capillary: 129 mg/dL — ABNORMAL HIGH (ref 70–99)
Glucose-Capillary: 167 mg/dL — ABNORMAL HIGH (ref 70–99)

## 2019-06-26 LAB — CBG MONITORING, ED: Glucose-Capillary: 171 mg/dL — ABNORMAL HIGH (ref 70–99)

## 2019-06-26 LAB — PREGNANCY, URINE: Preg Test, Ur: NEGATIVE

## 2019-06-26 LAB — URINALYSIS, ROUTINE W REFLEX MICROSCOPIC
Bilirubin Urine: NEGATIVE
Glucose, UA: NEGATIVE mg/dL
Ketones, ur: NEGATIVE mg/dL
Nitrite: POSITIVE — AB
Protein, ur: NEGATIVE mg/dL
Specific Gravity, Urine: 1.025 (ref 1.005–1.030)
pH: 6 (ref 5.0–8.0)

## 2019-06-26 LAB — SARS CORONAVIRUS 2 BY RT PCR (HOSPITAL ORDER, PERFORMED IN ~~LOC~~ HOSPITAL LAB): SARS Coronavirus 2: NEGATIVE

## 2019-06-26 SURGERY — LUMBAR LAMINECTOMY/DECOMPRESSION MICRODISCECTOMY 1 LEVEL
Anesthesia: General | Site: Spine Lumbar | Laterality: Left

## 2019-06-26 MED ORDER — HYDROMORPHONE HCL 1 MG/ML IJ SOLN
1.0000 mg | Freq: Once | INTRAMUSCULAR | Status: AC
  Administered 2019-06-26: 1 mg via INTRAVENOUS
  Filled 2019-06-26: qty 1

## 2019-06-26 MED ORDER — ACETAMINOPHEN 325 MG PO TABS: 650.0000 mg | ORAL_TABLET | ORAL | Status: DC | PRN

## 2019-06-26 MED ORDER — FENTANYL CITRATE (PF) 100 MCG/2ML IJ SOLN: 25.0000 ug | INTRAMUSCULAR | Status: DC | PRN

## 2019-06-26 MED ORDER — THROMBIN 5000 UNITS EX SOLR
CUTANEOUS | Status: DC | PRN
  Administered 2019-06-26 (×2): 5000 [IU] via TOPICAL

## 2019-06-26 MED ORDER — PROPOFOL 10 MG/ML IV BOLUS
INTRAVENOUS | Status: AC
  Filled 2019-06-26: qty 20

## 2019-06-26 MED ORDER — HYDROCODONE-ACETAMINOPHEN 10-325 MG PO TABS
2.0000 | ORAL_TABLET | ORAL | Status: DC | PRN
  Administered 2019-06-26: 2 via ORAL
  Administered 2019-06-27: 1 via ORAL
  Filled 2019-06-26 (×2): qty 2

## 2019-06-26 MED ORDER — METFORMIN HCL 500 MG PO TABS
500.0000 mg | ORAL_TABLET | Freq: Two times a day (BID) | ORAL | Status: DC
  Administered 2019-06-27: 07:00:00 500 mg via ORAL
  Filled 2019-06-26: qty 1

## 2019-06-26 MED ORDER — CEFAZOLIN SODIUM-DEXTROSE 1-4 GM/50ML-% IV SOLN
1.0000 g | Freq: Three times a day (TID) | INTRAVENOUS | Status: AC
  Administered 2019-06-26 – 2019-06-27 (×2): 1 g via INTRAVENOUS
  Filled 2019-06-26 (×2): qty 50

## 2019-06-26 MED ORDER — KETOROLAC TROMETHAMINE 15 MG/ML IJ SOLN
30.0000 mg | Freq: Four times a day (QID) | INTRAMUSCULAR | Status: DC
  Administered 2019-06-26 – 2019-06-27 (×2): 30 mg via INTRAVENOUS
  Filled 2019-06-26 (×2): qty 2

## 2019-06-26 MED ORDER — HEMOSTATIC AGENTS (NO CHARGE) OPTIME
TOPICAL | Status: DC | PRN
  Administered 2019-06-26: 1 via TOPICAL

## 2019-06-26 MED ORDER — HYDROMORPHONE HCL 1 MG/ML IJ SOLN: 2.0000 mg | Freq: Once | INTRAMUSCULAR | Status: DC

## 2019-06-26 MED ORDER — ACETAMINOPHEN 160 MG/5ML PO SOLN: 1000.0000 mg | Freq: Once | ORAL | Status: DC | PRN

## 2019-06-26 MED ORDER — BUPIVACAINE HCL (PF) 0.25 % IJ SOLN
INTRAMUSCULAR | Status: AC
  Filled 2019-06-26: qty 30

## 2019-06-26 MED ORDER — KETOROLAC TROMETHAMINE 60 MG/2ML IM SOLN: 30.0000 mg | Freq: Once | INTRAMUSCULAR | Status: DC

## 2019-06-26 MED ORDER — LIDOCAINE HCL (CARDIAC) PF 100 MG/5ML IV SOSY
PREFILLED_SYRINGE | INTRAVENOUS | Status: DC | PRN
  Administered 2019-06-26: 60 mg via INTRAVENOUS

## 2019-06-26 MED ORDER — PROMETHAZINE HCL 25 MG/ML IJ SOLN
6.2500 mg | INTRAMUSCULAR | Status: DC | PRN
  Administered 2019-06-26: 12.5 mg via INTRAVENOUS

## 2019-06-26 MED ORDER — 0.9 % SODIUM CHLORIDE (POUR BTL) OPTIME
TOPICAL | Status: DC | PRN
  Administered 2019-06-26: 1000 mL

## 2019-06-26 MED ORDER — SUCCINYLCHOLINE CHLORIDE 20 MG/ML IJ SOLN
INTRAMUSCULAR | Status: DC | PRN
  Administered 2019-06-26: 140 mg via INTRAVENOUS

## 2019-06-26 MED ORDER — FENTANYL CITRATE (PF) 250 MCG/5ML IJ SOLN
INTRAMUSCULAR | Status: AC
  Filled 2019-06-26: qty 5

## 2019-06-26 MED ORDER — MIDAZOLAM HCL 5 MG/5ML IJ SOLN
INTRAMUSCULAR | Status: DC | PRN
  Administered 2019-06-26: 2 mg via INTRAVENOUS

## 2019-06-26 MED ORDER — HYDROXYZINE HCL 25 MG PO TABS
25.0000 mg | ORAL_TABLET | Freq: Four times a day (QID) | ORAL | Status: DC
  Administered 2019-06-26 – 2019-06-27 (×2): 25 mg via ORAL
  Filled 2019-06-26 (×2): qty 1

## 2019-06-26 MED ORDER — PHENOL 1.4 % MT LIQD: 1.0000 | OROMUCOSAL | Status: DC | PRN

## 2019-06-26 MED ORDER — MIDAZOLAM HCL 2 MG/2ML IJ SOLN
INTRAMUSCULAR | Status: AC
  Filled 2019-06-26: qty 2

## 2019-06-26 MED ORDER — ROCURONIUM BROMIDE 100 MG/10ML IV SOLN
INTRAVENOUS | Status: DC | PRN
  Administered 2019-06-26: 50 mg via INTRAVENOUS

## 2019-06-26 MED ORDER — OXYCODONE HCL 5 MG PO TABS: 5.0000 mg | ORAL_TABLET | Freq: Once | ORAL | Status: DC | PRN

## 2019-06-26 MED ORDER — MENTHOL 3 MG MT LOZG: 1.0000 | LOZENGE | OROMUCOSAL | Status: DC | PRN

## 2019-06-26 MED ORDER — ONDANSETRON HCL 4 MG/2ML IJ SOLN
INTRAMUSCULAR | Status: DC | PRN
  Administered 2019-06-26: 4 mg via INTRAVENOUS

## 2019-06-26 MED ORDER — SODIUM CHLORIDE 0.9% FLUSH: 3.0000 mL | Freq: Two times a day (BID) | INTRAVENOUS | Status: DC

## 2019-06-26 MED ORDER — ACETAMINOPHEN 10 MG/ML IV SOLN: 1000.0000 mg | Freq: Once | INTRAVENOUS | Status: DC | PRN

## 2019-06-26 MED ORDER — SUGAMMADEX SODIUM 200 MG/2ML IV SOLN
INTRAVENOUS | Status: DC | PRN
  Administered 2019-06-26: 300 mg via INTRAVENOUS

## 2019-06-26 MED ORDER — CYCLOBENZAPRINE HCL 10 MG PO TABS
10.0000 mg | ORAL_TABLET | Freq: Three times a day (TID) | ORAL | Status: DC | PRN
  Administered 2019-06-26: 23:00:00 10 mg via ORAL
  Filled 2019-06-26: qty 1

## 2019-06-26 MED ORDER — SODIUM CHLORIDE 0.9 % IV BOLUS
1000.0000 mL | Freq: Once | INTRAVENOUS | Status: AC
  Administered 2019-06-26: 1000 mL via INTRAVENOUS

## 2019-06-26 MED ORDER — ACETAMINOPHEN 650 MG RE SUPP: 650.0000 mg | RECTAL | Status: DC | PRN

## 2019-06-26 MED ORDER — SODIUM CHLORIDE 0.9 % IV SOLN: 250.0000 mL | INTRAVENOUS | Status: DC

## 2019-06-26 MED ORDER — HYDROCODONE-ACETAMINOPHEN 5-325 MG PO TABS: 1.0000 | ORAL_TABLET | ORAL | Status: DC | PRN

## 2019-06-26 MED ORDER — HYDROMORPHONE HCL 1 MG/ML IJ SOLN: 1.0000 mg | INTRAMUSCULAR | Status: DC | PRN

## 2019-06-26 MED ORDER — SODIUM CHLORIDE 0.9 % IV SOLN
INTRAVENOUS | Status: DC | PRN
  Administered 2019-06-26: 500 mL

## 2019-06-26 MED ORDER — SODIUM CHLORIDE 0.9% FLUSH: 3.0000 mL | INTRAVENOUS | Status: DC | PRN

## 2019-06-26 MED ORDER — CEFAZOLIN SODIUM-DEXTROSE 2-4 GM/100ML-% IV SOLN
2.0000 g | INTRAVENOUS | Status: AC
  Administered 2019-06-26: 2 g via INTRAVENOUS
  Filled 2019-06-26: qty 100

## 2019-06-26 MED ORDER — THROMBIN 5000 UNITS EX SOLR
CUTANEOUS | Status: AC
  Filled 2019-06-26: qty 10000

## 2019-06-26 MED ORDER — THROMBIN 5000 UNITS EX SOLR
CUTANEOUS | Status: AC
  Filled 2019-06-26: qty 5000

## 2019-06-26 MED ORDER — DEXAMETHASONE SODIUM PHOSPHATE 4 MG/ML IJ SOLN
INTRAMUSCULAR | Status: DC | PRN
  Administered 2019-06-26: 10 mg via INTRAVENOUS

## 2019-06-26 MED ORDER — ONDANSETRON HCL 4 MG PO TABS: 4.0000 mg | ORAL_TABLET | Freq: Four times a day (QID) | ORAL | Status: DC | PRN

## 2019-06-26 MED ORDER — FENTANYL CITRATE (PF) 100 MCG/2ML IJ SOLN
INTRAMUSCULAR | Status: DC | PRN
  Administered 2019-06-26: 100 ug via INTRAVENOUS
  Administered 2019-06-26: 50 ug via INTRAVENOUS

## 2019-06-26 MED ORDER — OXYCODONE HCL 5 MG/5ML PO SOLN: 5.0000 mg | Freq: Once | ORAL | Status: DC | PRN

## 2019-06-26 MED ORDER — PROMETHAZINE HCL 25 MG/ML IJ SOLN
INTRAMUSCULAR | Status: AC
  Filled 2019-06-26: qty 1

## 2019-06-26 MED ORDER — KETOROLAC TROMETHAMINE 15 MG/ML IJ SOLN
15.0000 mg | Freq: Once | INTRAMUSCULAR | Status: AC
  Administered 2019-06-26: 15 mg via INTRAVENOUS
  Filled 2019-06-26: qty 1

## 2019-06-26 MED ORDER — LACTATED RINGERS IV SOLN
INTRAVENOUS | Status: DC | PRN
  Administered 2019-06-26 (×2): via INTRAVENOUS

## 2019-06-26 MED ORDER — ONDANSETRON HCL 4 MG/2ML IJ SOLN: 4.0000 mg | Freq: Four times a day (QID) | INTRAMUSCULAR | Status: DC | PRN

## 2019-06-26 MED ORDER — MORPHINE SULFATE (PF) 4 MG/ML IV SOLN
4.0000 mg | Freq: Once | INTRAVENOUS | Status: AC
  Administered 2019-06-26: 4 mg via INTRAVENOUS
  Filled 2019-06-26: qty 1

## 2019-06-26 MED ORDER — PROPOFOL 10 MG/ML IV BOLUS
INTRAVENOUS | Status: DC | PRN
  Administered 2019-06-26: 200 mg via INTRAVENOUS

## 2019-06-26 MED ORDER — ACETAMINOPHEN 500 MG PO TABS: 1000.0000 mg | ORAL_TABLET | Freq: Once | ORAL | Status: DC | PRN

## 2019-06-26 SURGICAL SUPPLY — 53 items
BAG DECANTER FOR FLEXI CONT (MISCELLANEOUS) ×3 IMPLANT
BENZOIN TINCTURE PRP APPL 2/3 (GAUZE/BANDAGES/DRESSINGS) ×3 IMPLANT
BLADE CLIPPER SURG (BLADE) IMPLANT
BUR CUTTER 7.0 ROUND (BURR) ×3 IMPLANT
CANISTER SUCT 3000ML PPV (MISCELLANEOUS) ×3 IMPLANT
CARTRIDGE OIL MAESTRO DRILL (MISCELLANEOUS) ×1 IMPLANT
CLOSURE WOUND 1/2 X4 (GAUZE/BANDAGES/DRESSINGS) ×1
COVER WAND RF STERILE (DRAPES) ×3 IMPLANT
DERMABOND ADVANCED (GAUZE/BANDAGES/DRESSINGS) ×2
DERMABOND ADVANCED .7 DNX12 (GAUZE/BANDAGES/DRESSINGS) ×1 IMPLANT
DIFFUSER DRILL AIR PNEUMATIC (MISCELLANEOUS) ×3 IMPLANT
DRAPE HALF SHEET 40X57 (DRAPES) IMPLANT
DRAPE LAPAROTOMY 100X72X124 (DRAPES) ×3 IMPLANT
DRAPE MICROSCOPE LEICA (MISCELLANEOUS) ×3 IMPLANT
DRAPE SURG 17X23 STRL (DRAPES) ×6 IMPLANT
DRSG OPSITE POSTOP 4X6 (GAUZE/BANDAGES/DRESSINGS) ×2 IMPLANT
DURAPREP 26ML APPLICATOR (WOUND CARE) ×3 IMPLANT
ELECT REM PT RETURN 9FT ADLT (ELECTROSURGICAL) ×3
ELECTRODE REM PT RTRN 9FT ADLT (ELECTROSURGICAL) ×1 IMPLANT
GAUZE 4X4 16PLY RFD (DISPOSABLE) IMPLANT
GAUZE SPONGE 4X4 12PLY STRL (GAUZE/BANDAGES/DRESSINGS) ×3 IMPLANT
GLOVE BIO SURGEON STRL SZ 6.5 (GLOVE) ×2 IMPLANT
GLOVE BIO SURGEON STRL SZ7 (GLOVE) ×4 IMPLANT
GLOVE BIO SURGEONS STRL SZ 6.5 (GLOVE) ×2
GLOVE BIOGEL PI IND STRL 6.5 (GLOVE) IMPLANT
GLOVE BIOGEL PI IND STRL 7.0 (GLOVE) IMPLANT
GLOVE BIOGEL PI IND STRL 7.5 (GLOVE) IMPLANT
GLOVE BIOGEL PI INDICATOR 6.5 (GLOVE) ×4
GLOVE BIOGEL PI INDICATOR 7.0 (GLOVE) ×4
GLOVE BIOGEL PI INDICATOR 7.5 (GLOVE) ×4
GLOVE ECLIPSE 9.0 STRL (GLOVE) ×3 IMPLANT
GOWN STRL REUS W/ TWL LRG LVL3 (GOWN DISPOSABLE) IMPLANT
GOWN STRL REUS W/ TWL XL LVL3 (GOWN DISPOSABLE) ×1 IMPLANT
GOWN STRL REUS W/TWL 2XL LVL3 (GOWN DISPOSABLE) IMPLANT
GOWN STRL REUS W/TWL LRG LVL3 (GOWN DISPOSABLE) ×4
GOWN STRL REUS W/TWL XL LVL3 (GOWN DISPOSABLE) ×4
KIT BASIN OR (CUSTOM PROCEDURE TRAY) ×3 IMPLANT
KIT TURNOVER KIT B (KITS) ×3 IMPLANT
NDL SPNL 22GX3.5 QUINCKE BK (NEEDLE) IMPLANT
NEEDLE HYPO 22GX1.5 SAFETY (NEEDLE) ×3 IMPLANT
NEEDLE SPNL 22GX3.5 QUINCKE BK (NEEDLE) ×3 IMPLANT
NS IRRIG 1000ML POUR BTL (IV SOLUTION) ×3 IMPLANT
OIL CARTRIDGE MAESTRO DRILL (MISCELLANEOUS) ×3
PACK LAMINECTOMY NEURO (CUSTOM PROCEDURE TRAY) ×3 IMPLANT
PAD ARMBOARD 7.5X6 YLW CONV (MISCELLANEOUS) ×9 IMPLANT
RUBBERBAND STERILE (MISCELLANEOUS) ×6 IMPLANT
SPONGE SURGIFOAM ABS GEL SZ50 (HEMOSTASIS) ×3 IMPLANT
STRIP CLOSURE SKIN 1/2X4 (GAUZE/BANDAGES/DRESSINGS) ×2 IMPLANT
SUT VIC AB 2-0 CT1 18 (SUTURE) ×3 IMPLANT
SUT VIC AB 3-0 SH 8-18 (SUTURE) ×3 IMPLANT
TOWEL GREEN STERILE (TOWEL DISPOSABLE) ×3 IMPLANT
TOWEL GREEN STERILE FF (TOWEL DISPOSABLE) ×3 IMPLANT
WATER STERILE IRR 1000ML POUR (IV SOLUTION) ×3 IMPLANT

## 2019-06-26 NOTE — Anesthesia Postprocedure Evaluation (Signed)
Anesthesia Post Note  Patient: [name]  Procedure(s) Performed: THORACIC TWELVE-LUMBAR ONE LAMINECTOMY/DECOMPRESSION MICRODISCECTOMY   T12-L1 left (Left Spine Lumbar)     Patient location during evaluation: PACU Anesthesia Type: General Level of consciousness: awake and alert Pain management: pain level controlled Vital Signs Assessment: post-procedure vital signs reviewed and stable Respiratory status: spontaneous breathing, nonlabored ventilation and respiratory function stable Cardiovascular status: blood pressure returned to baseline and stable Postop Assessment: no apparent nausea or vomiting Anesthetic complications: yes Anesthetic complication details: anesthesia complicationsComments: See intraop note from CRNA. Essentially, patient bit down while being suctioned during emergence and suffered a dental injury.     Last Vitals:  Vitals:   06/26/19 2156 06/26/19 2215  BP: 140/83 (!) 148/85  Pulse: 81 84  Resp: 19 18  Temp:  36.9 C  SpO2: 100% 100%    Last Pain:  Vitals:   06/26/19 2215  TempSrc: Oral  PainSc:                  [name]

## 2019-06-26 NOTE — Anesthesia Procedure Notes (Signed)
Procedure Name: Intubation Date/Time: 06/26/2019 7:14 PM Performed by: Oletta Lamas, CRNA Pre-anesthesia Checklist: Patient identified, Emergency Drugs available, Suction available and Patient being monitored Patient Re-evaluated:Patient Re-evaluated prior to induction Oxygen Delivery Method: Circle System Utilized Preoxygenation: Pre-oxygenation with 100% oxygen Induction Type: IV induction Ventilation: Mask ventilation without difficulty Laryngoscope Size: Miller and 2 Grade View: Grade I Tube type: Oral Tube size: 7.5 mm Number of attempts: 1 Airway Equipment and Method: Stylet Placement Confirmation: ETT inserted through vocal cords under direct vision,  positive ETCO2 and breath sounds checked- equal and bilateral Secured at: 22 cm Tube secured with: Tape Dental Injury: Teeth and Oropharynx as per pre-operative assessment

## 2019-06-26 NOTE — H&P (Signed)
[name] is an 28 y.o. female.   Chief Complaint: Back pain HPI: 28 year old female with acute onset of back pain with radiating pain numbness and weakness predominantly in her right lower extremity beginning approximately 36 hours ago.  No history of trauma.  No prior symptoms.  Patient complains of near total sensory loss in her right lower extremity with diffuse weakness.  Patient also has some vague sensory changes in her left lower extremity.  Patient unable to stand or ambulate.  Pain is severe.  Work-up including an MRI scan demonstrates a large left-sided T12-L1 disc herniation off to the left side with marked cord compression  Past Medical History:  Diagnosis Date  . Diabetes mellitus without complication (HCC)   . Hypertension    age 28  . Migraine   . Morbid obesity (HCC)   . Ovarian cyst     Past Surgical History:  Procedure Laterality Date  . CHOLECYSTECTOMY    . TUBES REMOVED      Family History  Problem Relation Age of Onset  . Hypertension Mother    Social History:  reports that she has been smoking cigarettes. She has been smoking about 1.00 pack per day. She has never used smokeless tobacco. She reports current alcohol use. She reports that she does not use drugs.  Allergies: No Known Allergies  (Not in a hospital admission)   Results for orders placed or performed during the hospital encounter of 06/26/19 (from the past 48 hour(s))  CBG monitoring, ED     Status: Abnormal   Collection Time: 06/26/19  7:49 AM  Result Value Ref Range   Glucose-Capillary 171 (H) 70 - 99 mg/dL  Basic metabolic panel     Status: Abnormal   Collection Time: 06/26/19  7:56 AM  Result Value Ref Range   Sodium 137 135 - 145 mmol/L   Potassium 3.5 3.5 - 5.1 mmol/L   Chloride 102 98 - 111 mmol/L   CO2 26 22 - 32 mmol/L   Glucose, Bld 171 (H) 70 - 99 mg/dL   BUN 7 6 - 20 mg/dL   Creatinine, Ser 2.840.56 0.44 - 1.00 mg/dL   Calcium 8.5 (L) 8.9 - 10.3 mg/dL   GFR calc non Af  Amer >60 >60 mL/min   GFR calc Af Amer >60 >60 mL/min   Anion gap 9 5 - 15    Comment: Performed at [facility], [address] [place], [place] [id number]  CBC with Differential     Status: None   Collection Time: 06/26/19  7:56 AM  Result Value Ref Range   WBC 10.1 4.0 - 10.5 K/uL   RBC 4.24 3.87 - 5.11 MIL/uL   Hemoglobin 12.6 12.0 - 15.0 g/dL   HCT 01.037.6 27.236.0 - 53.646.0 %   MCV 88.7 80.0 - 100.0 fL   MCH 29.7 26.0 - 34.0 pg   MCHC 33.5 30.0 - 36.0 g/dL   RDW 64.412.6 03.411.5 - 74.215.5 %   Platelets 214 150 - 400 K/uL   nRBC 0.0 0.0 - 0.2 %   Neutrophils Relative % 67 %   Neutro Abs 6.7 1.7 - 7.7 K/uL   Lymphocytes Relative 24 %   Lymphs Abs 2.5 0.7 - 4.0 K/uL   Monocytes Relative 6 %   Monocytes Absolute 0.6 0.1 - 1.0 K/uL   Eosinophils Relative 3 %   Eosinophils Absolute 0.3 0.0 - 0.5 K/uL   Basophils Relative 0 %   Basophils Absolute 0.0 0.0 -  0.1 K/uL   Immature Granulocytes 0 %   Abs Immature Granulocytes 0.03 0.00 - 0.07 K/uL    Comment: Performed at [facility], [address] [place], [place] [address]  Urinalysis, Routine w reflex microscopic     Status: Abnormal   Collection Time: 06/26/19  7:56 AM  Result Value Ref Range   Color, Urine YELLOW YELLOW   APPearance CLOUDY (A) CLEAR   Specific Gravity, Urine 1.025 1.005 - 1.030   pH 6.0 5.0 - 8.0   Glucose, UA NEGATIVE NEGATIVE mg/dL   Hgb urine dipstick SMALL (A) NEGATIVE   Bilirubin Urine NEGATIVE NEGATIVE   Ketones, ur NEGATIVE NEGATIVE mg/dL   Protein, ur NEGATIVE NEGATIVE mg/dL   Nitrite POSITIVE (A) NEGATIVE   Leukocytes,Ua MODERATE (A) NEGATIVE    Comment: Performed at [facility], [address] [place], [place] [address]  CK     Status: None   Collection Time: 06/26/19  7:56 AM  Result Value Ref Range   Total CK 114 38 - 234 U/L    Comment: Performed at [facility], [address] [place], [place] [address]  Urinalysis, Microscopic (reflex)     Status: Abnormal    Collection Time: 06/26/19  7:56 AM  Result Value Ref Range   RBC / HPF 6-10 0 - 5 RBC/hpf   WBC, UA >50 0 - 5 WBC/hpf   Bacteria, UA MANY (A) NONE SEEN   Squamous Epithelial / LPF 0-5 0 - 5    Comment: Performed at [facility], [address] [place], [place] [address]  Pregnancy, urine     Status: None   Collection Time: 06/26/19  7:56 AM  Result Value Ref Range   Preg Test, Ur NEGATIVE NEGATIVE    Comment:        THE SENSITIVITY OF THIS METHODOLOGY IS >20 mIU/mL. Performed at [facility], [address] [place], [place] [address]    Mr Lumbar Spine Wo Contrast  Result Date: 06/26/2019 CLINICAL DATA:  Back pain. Right leg numbness. Right leg weakness. Cauda equina syndrome. EXAM: MRI LUMBAR SPINE WITHOUT CONTRAST TECHNIQUE: Multiplanar, multisequence MR imaging of the lumbar spine was performed. No intravenous contrast was administered. COMPARISON:  Radiography 12/19/2010 FINDINGS: Segmentation:  5 lumbar type vertebral bodies. Alignment:  Straightening of the normal lumbar lordosis. Vertebrae:  No fracture or primary bone lesion. Conus medullaris and cauda equina: Conus extends to the L1 level. See below regarding T12-L1 disc pathology. Paraspinal and other soft tissues: Negative Disc levels: T11-12: Normal. T12-L1: Large left posterolateral disc herniation. Spinal stenosis affecting the left side of the spinal canal, with deviation of the distal cord/conus to the right with some cord deformity. L1-2 and L2-3: Normal disc spared minimal facet hypertrophy. No compressive stenosis. L3-4: Disc degeneration with annular bulging. Mild facet hypertrophy. Mild spinal stenosis but without apparent neural compression. L4-5: Disc degeneration with annular bulging. Facet and ligamentous hypertrophy. Moderate stenosis, particularly affecting the lateral recesses , which could possibly be symptomatic. L5-S1: Left paracentral disc herniation contacts the ventral thecal sac and the  S1 nerve as it buds from the thecal sac. Definite neural compression is not present. There is mild facet degeneration and hypertrophy, left worse than right, with some left foraminal stenosis because of this. IMPRESSION: Large left posterolateral disc herniation at T12-L1 with stenosis of the canal on the left and displacement of the distal cord/conus towards the right with  some distal cord deformity. L3-4: Annular bulging. Mild facet hypertrophy. Mild stenosis without focal neural compression. L4-5: Annular bulging. Facet and ligamentous hypertrophy. Moderate stenosis, particularly of the lateral recesses were either L5 nerve could be affected. L5-S1: Left paracentral disc herniation contacts the thecal sac and the left S1 root sleeve. Definite neural compression is not demonstrated however. The patient also has some facet degeneration and hypertrophy at this level with mild left foraminal stenosis. Electronically Signed   By: Paulina FusiMark  Shogry M.D.   On: 06/26/2019 14:49   [place] Venous Img Lower Unilateral Right  Result Date: 06/26/2019 CLINICAL DATA:  28 year old female with right thigh pain and swelling EXAM: RIGHT LOWER EXTREMITY VENOUS DOPPLER ULTRASOUND TECHNIQUE: Gray-scale sonography with graded compression, as well as color Doppler and duplex ultrasound were performed to evaluate the lower extremity deep venous systems from the level of the common femoral vein and including the common femoral, femoral, profunda femoral, popliteal and calf veins including the posterior tibial, peroneal and gastrocnemius veins when visible. The superficial great saphenous vein was also interrogated. Spectral Doppler was utilized to evaluate flow at rest and with distal augmentation maneuvers in the common femoral, femoral and popliteal veins. COMPARISON:  None. FINDINGS: Contralateral Common Femoral Vein: Respiratory phasicity is normal and symmetric with the symptomatic side. No evidence of thrombus. Normal compressibility.  Common Femoral Vein: No evidence of thrombus. Normal compressibility, respiratory phasicity and response to augmentation. Saphenofemoral Junction: No evidence of thrombus. Normal compressibility and flow on color Doppler imaging. Profunda Femoral Vein: No evidence of thrombus. Normal compressibility and flow on color Doppler imaging. Femoral Vein: No evidence of thrombus. Normal compressibility, respiratory phasicity and response to augmentation. Popliteal Vein: No evidence of thrombus. Normal compressibility, respiratory phasicity and response to augmentation. Calf Veins: No evidence of thrombus. Normal compressibility and flow on color Doppler imaging. Superficial Great Saphenous Vein: No evidence of thrombus. Normal compressibility. Venous Reflux:  None. Other Findings:  None. IMPRESSION: No evidence of deep venous thrombosis. Electronically Signed   By: Malachy MoanHeath  McCullough M.D.   On: 06/26/2019 09:40   Dg Femur Min 2 Views Right  Result Date: 06/26/2019 CLINICAL DATA:  Right thigh pain and swelling beginning yesterday. No known injury. EXAM: RIGHT FEMUR 2 VIEWS COMPARISON:  None. FINDINGS: There is no evidence of fracture or other focal bone lesions. Soft tissues are unremarkable. IMPRESSION: Negative. Electronically Signed   By: Danae OrleansJohn A Stahl M.D.   On: 06/26/2019 08:44    Pertinent items noted in HPI and remainder of comprehensive ROS otherwise negative.  Blood pressure (!) 142/92, pulse 71, temperature 98.3 F (36.8 C), temperature source Oral, resp. rate 16, height 5' 8.5" (1.74 m), weight 112.9 kg, last menstrual period 05/25/2019, SpO2 100 %.  Patient is awake and aware.  She is obviously in great pain.  She is oriented and reasonably appropriate otherwise.  Cranial nerve function is normal bilaterally.  Speech is fluent.  Judgment insight appear intact.  Motor examination of the upper extremities 5/5 bilaterally.  Motor examination lower extremities with 3/5 strength in her right lower extremity  in all muscle groups.  Left lower extremity motor strength 4+/5 without obvious deficit and I think this is somewhat limited by pain.  Sensory examination with decrease sensation to pain and light touch in her right lower extremity at a L1 level.  Left lower extremity with some vague loss of pain sensation.  Relative sensory level bilaterally around L1 in her posterior back.  Reflexes are hypoactive bilaterally.  No evidence  of long track signs.  Examination head ears eyes and throat is unremarked.  Chest and abdomen are obese but otherwise benign.  Extremities are free from injury or deformity. 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  I suspect the symptoms on the right side being worse are secondary to cord deviation and dorsal compression.  I discussed situation with the patient and her mother.  I recommended we move forward urgently with left-sided T12-L1 laminotomy and microdiscectomy in hopes of improving her symptoms.  Discussed the risks involved with surgery including but not limited to the risk of anesthesia, bleeding, infection, CSF leak, nerve root injury, spinal cord injury, disc reherniation, later instability, continued pain, and non-benefit.  The patient has been given the opportunity ask questions.  She appears to understand.  She wishes to proceed with surgery.  Mallie Mussel A Makisha Marrin 06/26/2019, 4:06 PM

## 2019-06-26 NOTE — ED Triage Notes (Addendum)
Pt requesting to have IV removed. MRI contacted, IV is not needed for treatment. Pt denies needing any medication for pain or for claustrophobic. IV removed per pt request

## 2019-06-26 NOTE — ED Provider Notes (Signed)
[facility] EMERGENCY DEPARTMENT Provider Note   CSN: [id number] Arrival date & time: 06/26/19  6568     History   Chief Complaint Chief Complaint  Patient presents with  . Leg Pain    HPI [name] is a 28 y.o. female.     HPI  28 year old female presents with right leg numbness and pain.  She is concerned for muscle etiology in her right thigh.  She noticed some numbness at work where she has a new job where she is standing much more often.  Then her right leg seem to give out on her and she has been having pain and weakness since.  The numbness feels diffuse.  There is some mild back pain but this is chronic and unchanged from typical.  No incontinence.  Pain is severe. Has noticed right ankle swelling.  Past Medical History:  Diagnosis Date  . Diabetes mellitus without complication (HCC)   . Hypertension    age 15  . Migraine   . Morbid obesity (HCC)   . Ovarian cyst     Patient Active Problem List   Diagnosis Date Noted  . SIRS (systemic inflammatory response syndrome) (HCC) 04/06/2015  . Hyperglycemia 04/06/2015  . Nausea and vomiting 04/06/2015  . Migraine 04/06/2015  . Essential hypertension 04/06/2015  . Syncope 04/06/2015    Past Surgical History:  Procedure Laterality Date  . CHOLECYSTECTOMY    . TUBES REMOVED       OB History   No obstetric history on file.      Home Medications    Prior to Admission medications   Medication Sig Start Date End Date Taking? Authorizing Provider  cephALEXin (KEFLEX) 500 MG capsule Take 1 capsule (500 mg total) by mouth 3 (three) times daily. 2 caps po bid x 7 days 01/22/18   Fayrene Helper, PA-C  hydrOXYzine (ATARAX/VISTARIL) 25 MG tablet Take 1 tablet (25 mg total) by mouth every 6 (six) hours. 02/10/18   Joy, Shawn C, PA-C  ibuprofen (ADVIL,MOTRIN) 800 MG tablet Take 1 tablet (800 mg total) by mouth every 8 (eight) hours as needed for moderate pain. 01/22/18   Fayrene Helper, PA-C  metFORMIN  (GLUCOPHAGE) 500 MG tablet Take 1 tablet (500 mg total) by mouth 2 (two) times daily with a meal. 04/29/15   Mabe, Latanya Maudlin, MD    Family History Family History  Problem Relation Age of Onset  . Hypertension Mother     Social History Social History   Tobacco Use  . Smoking status: Current Every Day Smoker    Packs/day: 1.00    Types: Cigarettes  . Smokeless tobacco: Never Used  Substance Use Topics  . Alcohol use: Yes    Comment: occassionally  . Drug use: No     Allergies   Patient has no known allergies.   Review of Systems Review of Systems  Musculoskeletal: Positive for myalgias.  Neurological: Positive for weakness and numbness.  All other systems reviewed and are negative.    Physical Exam Updated Vital Signs BP (!) 145/83 (BP Location: Right Arm)   Pulse 75   Temp 98.3 F (36.8 C) (Oral)   Resp 18   Ht 5' 8.5" (1.74 m)   Wt 112.9 kg   LMP 05/25/2019 (Approximate)   SpO2 99%   BMI 37.31 kg/m   Physical Exam Vitals signs and nursing note reviewed.  Constitutional:      Appearance: She is well-developed. She is obese.  HENT:  Head: Normocephalic and atraumatic.     Right Ear: External ear normal.     Left Ear: External ear normal.     Nose: Nose normal.  Eyes:     General:        Right eye: No discharge.        Left eye: No discharge.  Cardiovascular:     Rate and Rhythm: Normal rate and regular rhythm.     Pulses:          Dorsalis pedis pulses are 2+ on the right side and 2+ on the left side.  Pulmonary:     Effort: Pulmonary effort is normal.  Abdominal:     General: There is no distension.  Musculoskeletal:     Right knee: She exhibits normal range of motion and no swelling. No tenderness found.     Right ankle: She exhibits swelling (mild).     Lumbar back: She exhibits no tenderness.     Right upper leg: She exhibits tenderness. She exhibits no swelling.     Right lower leg: She exhibits no tenderness and no swelling.        Legs:     Comments: Reports decreased sensation throughout right leg. On pinprick, she does occasionally notice sharp sensation. She cannot lift leg up off stretcher on her own, but when I try to range her hip/leg and let go, her leg stays up in the air. Most of the pain induced with this is in the distal thigh. No knee swelling. No calf tenderness.  Skin:    General: Skin is warm and dry.  Neurological:     Mental Status: She is alert.  Psychiatric:        Mood and Affect: Mood is not anxious.      ED Treatments / Results  Labs (all labs ordered are listed, but only abnormal results are displayed) Labs Reviewed  BASIC METABOLIC PANEL - Abnormal; Notable for the following components:      Result Value   Glucose, Bld 171 (*)    Calcium 8.5 (*)    All other components within normal limits  URINALYSIS, ROUTINE W REFLEX MICROSCOPIC - Abnormal; Notable for the following components:   APPearance CLOUDY (*)    Hgb urine dipstick SMALL (*)    Nitrite POSITIVE (*)    Leukocytes,Ua MODERATE (*)    All other components within normal limits  URINALYSIS, MICROSCOPIC (REFLEX) - Abnormal; Notable for the following components:   Bacteria, UA MANY (*)    All other components within normal limits  CBG MONITORING, ED - Abnormal; Notable for the following components:   Glucose-Capillary 171 (*)    All other components within normal limits  CBC WITH DIFFERENTIAL/PLATELET  CK  PREGNANCY, URINE    EKG None  Radiology [place] Venous Img Lower Unilateral Right  Result Date: 06/26/2019 CLINICAL DATA:  28 year old female with right thigh pain and swelling EXAM: RIGHT LOWER EXTREMITY VENOUS DOPPLER ULTRASOUND TECHNIQUE: Gray-scale sonography with graded compression, as well as color Doppler and duplex ultrasound were performed to evaluate the lower extremity deep venous systems from the level of the common femoral vein and including the common femoral, femoral, profunda femoral, popliteal and calf veins  including the posterior tibial, peroneal and gastrocnemius veins when visible. The superficial great saphenous vein was also interrogated. Spectral Doppler was utilized to evaluate flow at rest and with distal augmentation maneuvers in the common femoral, femoral and popliteal veins. COMPARISON:  None. FINDINGS: Contralateral Common Femoral Vein:  Respiratory phasicity is normal and symmetric with the symptomatic side. No evidence of thrombus. Normal compressibility. Common Femoral Vein: No evidence of thrombus. Normal compressibility, respiratory phasicity and response to augmentation. Saphenofemoral Junction: No evidence of thrombus. Normal compressibility and flow on color Doppler imaging. Profunda Femoral Vein: No evidence of thrombus. Normal compressibility and flow on color Doppler imaging. Femoral Vein: No evidence of thrombus. Normal compressibility, respiratory phasicity and response to augmentation. Popliteal Vein: No evidence of thrombus. Normal compressibility, respiratory phasicity and response to augmentation. Calf Veins: No evidence of thrombus. Normal compressibility and flow on color Doppler imaging. Superficial Great Saphenous Vein: No evidence of thrombus. Normal compressibility. Venous Reflux:  None. Other Findings:  None. IMPRESSION: No evidence of deep venous thrombosis. Electronically Signed   By: Jacqulynn Cadet M.D.   On: 06/26/2019 09:40   Dg Femur Min 2 Views Right  Result Date: 06/26/2019 CLINICAL DATA:  Right thigh pain and swelling beginning yesterday. No known injury. EXAM: RIGHT FEMUR 2 VIEWS COMPARISON:  None. FINDINGS: There is no evidence of fracture or other focal bone lesions. Soft tissues are unremarkable. IMPRESSION: Negative. Electronically Signed   By: Marlaine Hind M.D.   On: 06/26/2019 08:44    Procedures Procedures (including critical care time)  Medications Ordered in ED Medications  HYDROmorphone (DILAUDID) injection 1 mg (1 mg Intravenous Given 06/26/19 0816)   ketorolac (TORADOL) 15 MG/ML injection 15 mg (15 mg Intravenous Given 06/26/19 0815)  sodium chloride 0.9 % bolus 1,000 mL (1,000 mLs Intravenous New Bag/Given 06/26/19 2637)     Initial Impression / Assessment and Plan / ED Course  I have reviewed the triage vital signs and the nursing notes.  Pertinent labs & imaging results that were available during my care of the patient were reviewed by me and considered in my medical decision making (see chart for details).        Patient is moving her leg much better after pain control.  However she still reports numbness.  Given there is a lot of tenderness in her thigh this could be primary thigh muscle pain.  I highly doubt compartment syndrome.  However the numbness would be pretty atypical for this and so I think there is also concerned this is neuropathic pain coming from her back.  I think she will need an MRI of her lumbar spine.  I discussed with Dr. Darl Householder, EDP at [facility], who accepts in transfer.  Final Clinical Impressions(s) / ED Diagnoses   Final diagnoses:  Right thigh pain  Right leg numbness    ED Discharge Orders    None       Sherwood Gambler, MD 06/26/19 1003

## 2019-06-26 NOTE — Op Note (Signed)
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  Asst. Surgeon: Reinaldo Meeker, NP  Anesthesia: General  Indication: 28 year old female with 36 hours history of severe acute onset back pain with radiation into her right lower extremity with severe weakness and sensory loss involving her entire right lower extremity.  Patient with relative sensory level around T12-L1.  Patient also has some left lower extremity sensory dysfunction but good motor strength.  Work-up demonstrates evidence of a very large left-sided T12-L1 disc herniation with severe spinal cord compression.  Even though the disc herniation is contralateral to her major symptoms I think this is the symptomatic lesion and I have discussed moving forward with emergent left-sided T12-L1 laminotomy and microdiscectomy for hopeful improvement of her symptoms.  Operative note: After induction anesthesia, patient position prone onto Wilson frame and properly padded.  Lumbar region and lower thoracic region prepped and draped sterilely.  Incision made overlying T12-L1.  Dissection performed on the left.  Retractor placed.  X-ray taken.  Level confirmed.  Wide laminotomy was then performed using high-speed drill and Kerrison Rogers to remove the inferior aspect of the lamina of T12 the medial aspect of the T12-L1 facet joint complex and the superior aspect of the L1 lamina.  Ligament flavum was elevated and resected as well.  Microscope to run field used micro dissection of the spinal canal.  Working lateral to the thecal sac the disc space and disc herniation was identified.  The disc space itself was somewhat firm.  There was not a free fragment dissected free.  I then incised the disc space and working first incised the  disc space created a opening within the interspace and then began to push herniated disc material into this opening and removed using micro pituitaries.  A large amount of disc herniation was encountered and removed.  The thecal sac spinal cord and nerve roots all appear to be well decompressed.  There was no evidence of injury to thecal sac or nerve roots.  Minimal if any retraction of the thecal sac was performed during the surgery.  Wound is then irrigated with antibiotic solution.  Gelfoam was placed topically for hemostasis.  Wound is then closed in layers with Vicryl sutures.  Steri-Strips and sterile dressing were applied.  No apparent complications.  Patient tolerated the procedure well and she returns to the recovery room postop.

## 2019-06-26 NOTE — ED Notes (Signed)
US at bedside

## 2019-06-26 NOTE — Anesthesia Preprocedure Evaluation (Addendum)
Anesthesia Evaluation  Patient identified by MRN, date of birth, ID band Patient awake    Reviewed: Allergy & Precautions, Patient's Chart, lab work & pertinent test results  History of Anesthesia Complications Negative for: history of anesthetic complications  Airway Mallampati: II  TM Distance: >3 FB Neck ROM: Full    Dental  (+) Dental Advisory Given   Pulmonary neg COPD, neg recent URI, Current SmokerPatient did not abstain from smoking.,    breath sounds clear to auscultation       Cardiovascular hypertension (no meds),  Rhythm:Regular     Neuro/Psych  Headaches, negative psych ROS   GI/Hepatic negative GI ROS, Neg liver ROS,   Endo/Other  diabetes, Type 2, Oral Hypoglycemic AgentsMorbid obesity Obesity   Renal/GU negative Renal ROS     Musculoskeletal negative musculoskeletal ROS (+)   Abdominal   Peds  Hematology negative hematology ROS (+)   Anesthesia Other Findings   Reproductive/Obstetrics                            Anesthesia Physical Anesthesia Plan  ASA: II  Anesthesia Plan: General   Post-op Pain Management:    Induction: Intravenous  PONV Risk Score and Plan: 4 or greater and Treatment may vary due to age or medical condition, Ondansetron, Midazolam, Dexamethasone and Scopolamine patch - Pre-op  Airway Management Planned: Oral ETT  Additional Equipment: None  Intra-op Plan:   Post-operative Plan: Extubation in OR  Informed Consent: I have reviewed the patients History and Physical, chart, labs and discussed the procedure including the risks, benefits and alternatives for the proposed anesthesia with the patient or authorized representative who has indicated his/her understanding and acceptance.     Dental advisory given  Plan Discussed with: CRNA and Anesthesiologist  Anesthesia Plan Comments:        Anesthesia Quick Evaluation

## 2019-06-26 NOTE — ED Notes (Signed)
Report to Santiago Glad, Agricultural consultant at [facility] ED

## 2019-06-26 NOTE — ED Provider Notes (Signed)
Physical Exam  BP (!) 142/92   Pulse 71   Temp 98.3 F (36.8 C) (Oral)   Resp 16   Ht 5' 8.5" (1.74 m)   Wt 112.9 kg   LMP 05/25/2019 (Approximate)   SpO2 100%   BMI 37.31 kg/m   Physical Exam Vitals signs and nursing note reviewed.  Constitutional:      General: She is not in acute distress.    Appearance: She is well-developed. She is not diaphoretic.  HENT:     Head: Normocephalic and atraumatic.  Eyes:     General: No scleral icterus.    Conjunctiva/sclera: Conjunctivae normal.  Neck:     Musculoskeletal: Normal range of motion.  Pulmonary:     Effort: Pulmonary effort is normal. No respiratory distress.  Skin:    Findings: No rash.  Neurological:     Mental Status: She is alert.     ED Course/Procedures     Procedures  MDM  [facility] lumbar spine.  Briefly, patient is a 28 year old female presenting to the ED with right leg numbness and pain.  Reports different sensation on the right lower extremity compared to left.  She still is able to ambulate with assistance.  Denies any incontinence and does have associated back pain.  [facility], transferred here for MRI of the lumbar spine resulted below.  Mr Lumbar Spine Wo Contrast  Result Date: 06/26/2019 CLINICAL DATA:  Back pain. Right leg numbness. Right leg weakness. Cauda equina syndrome. EXAM: MRI LUMBAR SPINE WITHOUT CONTRAST TECHNIQUE: Multiplanar, multisequence MR imaging of the lumbar spine was performed. No intravenous contrast was administered. COMPARISON:  Radiography 12/19/2010 FINDINGS: Segmentation:  5 lumbar type vertebral bodies. Alignment:  Straightening of the normal lumbar lordosis. Vertebrae:  No fracture or primary bone lesion. Conus medullaris and cauda equina: Conus extends to the L1 level. See below regarding T12-L1 disc pathology. Paraspinal and other soft tissues: Negative Disc levels: T11-12: Normal. T12-L1:  Large left posterolateral disc herniation. Spinal stenosis affecting the left side of the spinal canal, with deviation of the distal cord/conus to the right with some cord deformity. L1-2 and L2-3: Normal disc spared minimal facet hypertrophy. No compressive stenosis. L3-4: Disc degeneration with annular bulging. Mild facet hypertrophy. Mild spinal stenosis but without apparent neural compression. L4-5: Disc degeneration with annular bulging. Facet and ligamentous hypertrophy. Moderate stenosis, particularly affecting the lateral recesses , which could possibly be symptomatic. L5-S1: Left paracentral disc herniation contacts the ventral thecal sac and the S1 nerve as it buds from the thecal sac. Definite neural compression is not present. There is mild facet degeneration and hypertrophy, left worse than right, with some left foraminal stenosis because of this. IMPRESSION: Large left posterolateral disc herniation at T12-L1 with stenosis of the canal on the left and displacement of the distal cord/conus towards the right with some distal cord deformity. L3-4: Annular bulging. Mild facet hypertrophy. Mild stenosis without focal neural compression. L4-5: Annular bulging. Facet and ligamentous hypertrophy. Moderate stenosis, particularly of the lateral recesses were either L5 nerve could be affected. L5-S1: Left paracentral disc herniation contacts the thecal sac and the left S1 root sleeve. Definite neural compression is not demonstrated however. The patient also has some facet degeneration and hypertrophy at this level with mild left foraminal stenosis. Electronically Signed   By: Paulina FusiMark  Shogry M.D.   On: 06/26/2019 14:49   [place] Venous Img Lower Unilateral  Right  Result Date: 06/26/2019 CLINICAL DATA:  28 year old female with right thigh pain and swelling EXAM: RIGHT LOWER EXTREMITY VENOUS DOPPLER ULTRASOUND TECHNIQUE: Gray-scale sonography with graded compression, as well as color Doppler and duplex ultrasound were  performed to evaluate the lower extremity deep venous systems from the level of the common femoral vein and including the common femoral, femoral, profunda femoral, popliteal and calf veins including the posterior tibial, peroneal and gastrocnemius veins when visible. The superficial great saphenous vein was also interrogated. Spectral Doppler was utilized to evaluate flow at rest and with distal augmentation maneuvers in the common femoral, femoral and popliteal veins. COMPARISON:  None. FINDINGS: Contralateral Common Femoral Vein: Respiratory phasicity is normal and symmetric with the symptomatic side. No evidence of thrombus. Normal compressibility. Common Femoral Vein: No evidence of thrombus. Normal compressibility, respiratory phasicity and response to augmentation. Saphenofemoral Junction: No evidence of thrombus. Normal compressibility and flow on color Doppler imaging. Profunda Femoral Vein: No evidence of thrombus. Normal compressibility and flow on color Doppler imaging. Femoral Vein: No evidence of thrombus. Normal compressibility, respiratory phasicity and response to augmentation. Popliteal Vein: No evidence of thrombus. Normal compressibility, respiratory phasicity and response to augmentation. Calf Veins: No evidence of thrombus. Normal compressibility and flow on color Doppler imaging. Superficial Great Saphenous Vein: No evidence of thrombus. Normal compressibility. Venous Reflux:  None. Other Findings:  None. IMPRESSION: No evidence of deep venous thrombosis. Electronically Signed   By: Jacqulynn Cadet M.D.   On: 06/26/2019 09:40   Dg Femur Min 2 Views Right  Result Date: 06/26/2019 CLINICAL DATA:  Right thigh pain and swelling beginning yesterday. No known injury. EXAM: RIGHT FEMUR 2 VIEWS COMPARISON:  None. FINDINGS: There is no evidence of fracture or other focal bone lesions. Soft tissues are unremarkable. IMPRESSION: Negative. Electronically Signed   By: Marlaine Hind M.D.   On:  06/26/2019 08:44     3:33 PM Will contact neurosurgery for recommendations.  3:53 PM Spoke to Dr. Trenton Gammon, neurosurgeon who will evaluate the patient in consult.  4:13 PM Dr. Trenton Gammon in to evaluate patient, will take to OR.  Rapid COVID test ordered and I have informed nurse to collect this soon as possible.      Delia Heady, PA-C 06/26/19 1614    Charlesetta Shanks, MD 06/28/19 1414

## 2019-06-26 NOTE — ED Triage Notes (Signed)
Pt arrives with sudden onset R leg numbness and decreased mobility since 0730 yesterday. No fall or trauma. Unable to lift R leg at all, cannot hold up leg while in bed.

## 2019-06-26 NOTE — Transfer of Care (Signed)
Immediate Anesthesia Transfer of Care Note  Patient: [name]  Procedure(s) Performed: THORACIC TWELVE-LUMBAR ONE LAMINECTOMY/DECOMPRESSION MICRODISCECTOMY   T12-L1 left (Left Spine Lumbar)  Patient Location: PACU  Anesthesia Type:General  Level of Consciousness: awake, drowsy and patient cooperative  Airway & Oxygen Therapy: Patient Spontanous Breathing  Post-op Assessment: Report given to RN and Post -op Vital signs reviewed and stable  Post vital signs: Reviewed and stable  Last Vitals:  Vitals Value Taken Time  BP 151/93 06/26/19 2057  Temp    Pulse 100 06/26/19 2057  Resp 20 06/26/19 2057  SpO2 96 % 06/26/19 2057    Last Pain:  Vitals:   06/26/19 2057  TempSrc:   PainSc: 0-No pain         Complications: Loose top right incisor.  See intra-op note for details.  Tooth remains intact.  No other anesthesia related complications noted.

## 2019-06-26 NOTE — Brief Op Note (Signed)
06/26/2019  8:40 PM  PATIENT:  [name]  28 y.o. 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   Delay start of Pharmacological VTE agent (>24hrs) due to surgical blood loss or risk of bleeding: yes

## 2019-06-27 ENCOUNTER — Encounter (HOSPITAL_COMMUNITY): Payer: Self-pay | Admitting: Neurosurgery

## 2019-06-27 LAB — GLUCOSE, CAPILLARY: Glucose-Capillary: 260 mg/dL — ABNORMAL HIGH (ref 70–99)

## 2019-06-27 MED ORDER — HYDROCODONE-ACETAMINOPHEN 5-325 MG PO TABS: 1.0000 | ORAL_TABLET | ORAL | 0 refills | Status: DC | PRN

## 2019-06-27 MED ORDER — CYCLOBENZAPRINE HCL 10 MG PO TABS: 10.0000 mg | ORAL_TABLET | Freq: Three times a day (TID) | ORAL | 0 refills | Status: DC | PRN

## 2019-06-27 NOTE — Progress Notes (Signed)
PT Cancellation Note  Patient Details Name: Miasha Emmons MRN: [id number] DOB: 25-Apr-1991   Cancelled Treatment:    Reason Eval/Treat Not Completed: PT screened, no needs identified, will sign off   Roney Marion, [place] Pager [phone] Office [phone]    Colletta Maryland 06/27/2019, 10:28 AM

## 2019-06-27 NOTE — Discharge Summary (Signed)
Physician Discharge Summary  Patient ID: [name] MRN: [id number] DOB/AGE: Feb 16, 1991 28 y.o.  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  Work-up demonstrated a large T12-L1 disc herniation on the left side with severe cord compression.  Patient underwent emergency surgery last night.  Postop with the patient is done very well.  Her pain is completely resolved.  Her lower extremity strength and sensation is returned to normal.  She standing walking and voiding without problem.  Her pain is very well controlled.  She is ready for discharge home.  Consults:   Significant Diagnostic Studies:   Treatments:   Discharge Exam: Blood pressure 123/67, pulse 65, temperature 98.1 F (36.7 C), temperature source Oral, resp. rate 16, height 5' 8.5" (1.74 m), weight 112.9 kg, last menstrual period 05/25/2019, SpO2 100 %. Awake and alert.  Oriented and appropriate.  Cranial nerve function intact.  Motor and sensory function extremities normal.  Wound clean and dry.  Chest and abdomen benign.  Disposition: Discharge disposition: 01-Home or Self Care        Allergies as of 06/27/2019   No Known Allergies     Medication List    TAKE these medications   cephALEXin 500 MG capsule Commonly known as: Keflex Take 1 capsule (500 mg total) by mouth 3 (three) times daily. 2 caps po bid x 7 days   cyclobenzaprine 10 MG tablet Commonly known as: FLEXERIL Take 1 tablet (10 mg total) by mouth 3 (three) times daily as needed for muscle spasms.   HYDROcodone-acetaminophen 5-325 MG tablet Commonly known as: NORCO/VICODIN Take 1 tablet by mouth every 4 (four) hours as needed  for moderate pain ((score 4 to 6)).   hydrOXYzine 25 MG tablet Commonly known as: ATARAX/VISTARIL Take 1 tablet (25 mg total) by mouth every 6 (six) hours.   ibuprofen 800 MG tablet Commonly known as: ADVIL Take 1 tablet (800 mg total) by mouth every 8 (eight) hours as needed for moderate pain.   metFORMIN 500 MG tablet Commonly known as: GLUCOPHAGE Take 1 tablet (500 mg total) by mouth 2 (two) times daily with a meal.      Follow-up Information    [facility]   Contact information: [place] [phone]          Signed: Charlie Pitter 06/27/2019, 8:01 AM

## 2019-06-27 NOTE — Plan of Care (Signed)
Pt doing well. Pt given D/C instructions with verbal understanding. Rx's were sent to pharmacy by MD. Pt's incision is clean and dry with no sign of infection. Pt's IV was removed prior to D/C. Pt D/C'd home via wheelchair per MD order. Pt is stable @ D/C and has no other needs at this time. Anglia Blakley, RN  

## 2019-06-27 NOTE — Evaluation (Signed)
Occupational Therapy Evaluation Patient Details Name: [name] MRN: [id number] DOB: October 19, 1990 Today's Date: 06/27/2019    History of Present Illness 28 yo female s/p t12-L1 laminectomy )PMH HTn  Diabetes mellitus without complication Migraine, Morbid obesity  and Ovarian cyst.    Clinical Impression   Patient evaluated by Occupational Therapy with no further acute OT needs identified. All education has been completed and the patient has no further questions. See below for any follow-up Occupational Therapy or equipment needs. OT to sign off. Thank you for referral.  Spoke with PT [facility] regarding screening patient at this time for d/c home.     Follow Up Recommendations  No OT follow up    Equipment Recommendations  None recommended by OT    Recommendations for Other Services       Precautions / Restrictions Precautions Precautions: Back Precaution Comments: handout provided for back precautions and sex back handout for education  Restrictions Weight Bearing Restrictions: No      Mobility Bed Mobility Overal bed mobility: Modified Independent                Transfers Overall transfer level: Modified independent                    Balance                                           ADL either performed or assessed with clinical judgement   ADL Overall ADL's : Modified independent                                       General ADL Comments: demonstrates tub transfer, bed mobilty, figure 4 LB dressing   Back handout provided and reviewed adls in detail. Pt educated on:  set an alarm at night for medication, avoid sitting for long periods of time, correct bed positioning for sleeping, correct sequence for bed mobility, avoiding lifting more than 5 pounds and never wash directly over incision. All education is complete and patient indicates understanding.    Vision Baseline Vision/History: Wears glasses Wears  Glasses: At all times       Perception     Praxis      Pertinent Vitals/Pain Pain Assessment: No/denies pain     Hand Dominance Right   Extremity/Trunk Assessment Upper Extremity Assessment Upper Extremity Assessment: Overall WFL for tasks assessed   Lower Extremity Assessment Lower Extremity Assessment: Overall WFL for tasks assessed   Cervical / Trunk Assessment Cervical / Trunk Assessment: Other exceptions(s/p surg)   Communication Communication Communication: No difficulties   Cognition Arousal/Alertness: Awake/alert Behavior During Therapy: WFL for tasks assessed/performed Overall Cognitive Status: Within Functional Limits for tasks assessed                                     General Comments  incision dry and dressing intact    Exercises     Shoulder Instructions      Home Living Family/patient expects to be discharged to:: Private residence Living Arrangements: Parent;Other relatives;Non-relatives/Friends Available Help at Discharge: Family;Available 24 hours/day Type of Home: House Home Access: Stairs to enter [employer] of Steps: 2 Entrance Stairs-Rails: Can reach both Home Layout: One  level     Bathroom Shower/Tub: Chief Strategy OfficerTub/shower unit   Bathroom Toilet: Standard     Home Equipment: None   Additional Comments: lives with mother father grandmother brother boyfriend and ex step father , x4 dogs but dogs can go outside      Prior Functioning/Environment Level of Independence: Independent        Comments: works at a [employer], [employer] packing goods        OT Problem List:        OT Treatment/Interventions:      OT Goals(Current goals can be found in the care plan section)    OT Frequency:     Barriers to D/C:            Co-evaluation              AM-PAC OT "6 Clicks" Daily Activity     Outcome Measure Help from another person eating meals?: None Help from another person taking care of personal grooming?:  None Help from another person toileting, which includes using toliet, bedpan, or urinal?: None Help from another person bathing (including washing, rinsing, drying)?: None Help from another person to put on and taking off regular upper body clothing?: None Help from another person to put on and taking off regular lower body clothing?: None 6 Click Score: 24   End of Session Equipment Utilized During Treatment: Gait belt  Activity Tolerance: Patient tolerated treatment well Patient left: in bed;with call bell/phone within reach  OT Visit Diagnosis: Unsteadiness on feet (R26.81)                Time: [id number] OT Time Calculation (min): 20 min Charges:  OT General Charges $OT Visit: 1 Visit OT Evaluation $OT Eval Moderate Complexity: 1 Mod   Mateo FlowBrynn Elke Holtry, OTR/L  Acute Rehabilitation Services Pager: [phone] Office: [phone] .   Mateo FlowBrynn Tara Rud 06/27/2019, 9:51 AM

## 2019-06-27 NOTE — Discharge Instructions (Signed)

## 2019-06-29 MED FILL — HYDROCODON-APAP 5-325: 5-325 | 4 days supply | Qty: 40 | Fill #0

## 2019-06-29 MED FILL — METHYLPREDNISOLONE 4 MG TAB: 4 | 7 days supply | Qty: 21 | Fill #0

## 2020-11-26 ENCOUNTER — Emergency Department (HOSPITAL_BASED_OUTPATIENT_CLINIC_OR_DEPARTMENT_OTHER): Payer: Self-pay

## 2020-11-26 ENCOUNTER — Emergency Department (HOSPITAL_BASED_OUTPATIENT_CLINIC_OR_DEPARTMENT_OTHER)
Admission: EM | Admit: 2020-11-26 | Discharge: 2020-11-26 | Disposition: A | Discharge status: Home / Self Care | Payer: Self-pay | Attending: Emergency Medicine | Admitting: Emergency Medicine

## 2020-11-26 ENCOUNTER — Encounter (HOSPITAL_BASED_OUTPATIENT_CLINIC_OR_DEPARTMENT_OTHER): Payer: Self-pay | Admitting: Emergency Medicine

## 2020-11-26 ENCOUNTER — Other Ambulatory Visit: Payer: Self-pay

## 2020-11-26 ENCOUNTER — Other Ambulatory Visit (HOSPITAL_COMMUNITY): Payer: Self-pay | Admitting: Emergency Medicine

## 2020-11-26 DIAGNOSIS — N23 Unspecified renal colic: Secondary | ICD-10-CM | POA: Insufficient documentation

## 2020-11-26 DIAGNOSIS — E1165 Type 2 diabetes mellitus with hyperglycemia: Secondary | ICD-10-CM | POA: Insufficient documentation

## 2020-11-26 DIAGNOSIS — I10 Essential (primary) hypertension: Secondary | ICD-10-CM | POA: Insufficient documentation

## 2020-11-26 DIAGNOSIS — N201 Calculus of ureter: Secondary | ICD-10-CM

## 2020-11-26 DIAGNOSIS — N202 Calculus of kidney with calculus of ureter: Secondary | ICD-10-CM | POA: Insufficient documentation

## 2020-11-26 DIAGNOSIS — F17210 Nicotine dependence, cigarettes, uncomplicated: Secondary | ICD-10-CM | POA: Insufficient documentation

## 2020-11-26 LAB — URINALYSIS, ROUTINE W REFLEX MICROSCOPIC
Bilirubin Urine: NEGATIVE
Glucose, UA: NEGATIVE mg/dL
Ketones, ur: NEGATIVE mg/dL
Leukocytes,Ua: NEGATIVE
Nitrite: NEGATIVE
Protein, ur: 30 mg/dL — AB
Specific Gravity, Urine: >1.03 — ABNORMAL HIGH (ref 1.005–1.030)
pH: 5 (ref 5.0–8.0)

## 2020-11-26 LAB — CBC WITH DIFFERENTIAL/PLATELET
Abs Immature Granulocytes: 0.04 10*3/uL (ref 0.00–0.07)
Basophils Absolute: 0.1 10*3/uL (ref 0.0–0.1)
Basophils Relative: 1 %
Eosinophils Absolute: 0.3 10*3/uL (ref 0.0–0.5)
Eosinophils Relative: 2 %
HCT: 37.5 % (ref 36.0–46.0)
Hemoglobin: 13.2 g/dL (ref 12.0–15.0)
Immature Granulocytes: 0 %
Lymphocytes Relative: 20 %
Lymphs Abs: 2.9 10*3/uL (ref 0.7–4.0)
MCH: 30 pg (ref 26.0–34.0)
MCHC: 35.2 g/dL (ref 30.0–36.0)
MCV: 85.2 fL (ref 80.0–100.0)
Monocytes Absolute: 0.7 10*3/uL (ref 0.1–1.0)
Monocytes Relative: 5 %
Neutro Abs: 10.4 10*3/uL — ABNORMAL HIGH (ref 1.7–7.7)
Neutrophils Relative %: 72 %
Platelets: 247 10*3/uL (ref 150–400)
RBC: 4.4 MIL/uL (ref 3.87–5.11)
RDW: 12.1 % (ref 11.5–15.5)
WBC: 14.5 10*3/uL — ABNORMAL HIGH (ref 4.0–10.5)
nRBC: 0 % (ref 0.0–0.2)

## 2020-11-26 LAB — BASIC METABOLIC PANEL
Anion gap: 12 (ref 5–15)
BUN: 12 mg/dL (ref 6–20)
CO2: 22 mmol/L (ref 22–32)
Calcium: 8.7 mg/dL — ABNORMAL LOW (ref 8.9–10.3)
Chloride: 101 mmol/L (ref 98–111)
Creatinine, Ser: 1.08 mg/dL — ABNORMAL HIGH (ref 0.44–1.00)
GFR, Estimated: >60 mL/min (ref >60)
Glucose, Bld: 245 mg/dL — ABNORMAL HIGH (ref 70–99)
Potassium: 3.7 mmol/L (ref 3.5–5.1)
Sodium: 135 mmol/L (ref 135–145)

## 2020-11-26 LAB — URINALYSIS, MICROSCOPIC (REFLEX)

## 2020-11-26 LAB — HCG, QUANTITATIVE, PREGNANCY: hCG, Beta Chain, Quant, S: <1 m[IU]/mL (ref <5)

## 2020-11-26 MED ORDER — FENTANYL CITRATE (PF) 100 MCG/2ML IJ SOLN
50.0000 ug | Freq: Once | INTRAMUSCULAR | Status: AC
  Administered 2020-11-26: 50 ug via INTRAVENOUS
  Filled 2020-11-26: qty 2

## 2020-11-26 MED ORDER — LACTATED RINGERS IV BOLUS
1000.0000 mL | Freq: Once | INTRAVENOUS | Status: AC
  Administered 2020-11-26: 1000 mL via INTRAVENOUS

## 2020-11-26 MED ORDER — ONDANSETRON HCL 4 MG/2ML IJ SOLN
4.0000 mg | Freq: Once | INTRAMUSCULAR | Status: AC
  Administered 2020-11-26: 4 mg via INTRAVENOUS
  Filled 2020-11-26: qty 2

## 2020-11-26 MED ORDER — ONDANSETRON 8 MG PO TBDP: 8.0000 mg | ORAL_TABLET | Freq: Three times a day (TID) | ORAL | 0 refills | Status: DC | PRN

## 2020-11-26 MED ORDER — KETOROLAC TROMETHAMINE 30 MG/ML IJ SOLN
30.0000 mg | Freq: Once | INTRAMUSCULAR | Status: AC
  Administered 2020-11-26: 30 mg via INTRAVENOUS
  Filled 2020-11-26: qty 1

## 2020-11-26 MED ORDER — FENTANYL CITRATE (PF) 100 MCG/2ML IJ SOLN
100.0000 ug | Freq: Once | INTRAMUSCULAR | Status: AC
  Administered 2020-11-26: 100 ug via INTRAVENOUS
  Filled 2020-11-26: qty 2

## 2020-11-26 MED ORDER — SODIUM CHLORIDE 0.9 % IV BOLUS (SEPSIS)
1000.0000 mL | Freq: Once | INTRAVENOUS | Status: AC
  Administered 2020-11-26: 1000 mL via INTRAVENOUS

## 2020-11-26 MED ORDER — HYDROCODONE-ACETAMINOPHEN 5-325 MG PO TABS: 1.0000 | ORAL_TABLET | Freq: Four times a day (QID) | ORAL | 0 refills | Status: DC | PRN

## 2020-11-26 MED FILL — ONDANSETRON ODT 8 MG TABLET: 8 | 3 days supply | Qty: 8 | Fill #0

## 2020-11-26 MED FILL — HYDROCODON-APAP 5-325: 5-325 | 3 days supply | Qty: 10 | Fill #0

## 2020-11-26 NOTE — ED Provider Notes (Signed)
[facility] EMERGENCY DEPARTMENT Provider Note   CSN: [id number] Arrival date & time: 11/26/20  0205     History Chief Complaint  Patient presents with  . Flank Pain    [name] is a 30 y.o. female.  The history is provided by the patient and a significant other.  Flank Pain This is a new problem. The current episode started 1 to 2 hours ago. The problem occurs constantly. The problem has been rapidly worsening. Pertinent negatives include no chest pain and no abdominal pain. Exacerbated by: Movement and palpation. Nothing relieves the symptoms.   Patient history of hypertension, diabetes, obesity presents with sudden onset of right flank pain.  Patient reports this pain came on suddenly and is very severe.  She reports associated nausea/vomiting.  She reports decreased urine output    Past Medical History:  Diagnosis Date  . Diabetes mellitus without complication (HCC)   . Hypertension    age 55  . Migraine   . Morbid obesity (HCC)   . Ovarian cyst     Patient Active Problem List   Diagnosis Date Noted  . 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 SIRS (systemic inflammatory response syndrome) (HCC) 04/06/2015  . Hyperglycemia 04/06/2015  . Nausea and vomiting 04/06/2015  . Migraine 04/06/2015  . Essential hypertension 04/06/2015  . Syncope 04/06/2015    Past Surgical History:  Procedure Laterality Date  . CHOLECYSTECTOMY    . LUMBAR LAMINECTOMY/DECOMPRESSION MICRODISCECTOMY Left 06/26/2019   Procedure: THORACIC TWELVE-LUMBAR ONE LAMINECTOMY/DECOMPRESSION MICRODISCECTOMY   T12-L1 left;  Surgeon: Julio Sicks, MD;  Location: [facility] OR;  Service: Neurosurgery;  Laterality: Left;  . TUBES REMOVED       OB History   No obstetric history on file.     Family History  Problem Relation Age of Onset  . Hypertension Mother     Social History   Tobacco Use  . Smoking status: Current Every Day Smoker     Packs/day: 1.00    Types: Cigarettes  . Smokeless tobacco: Never Used  Substance Use Topics  . Alcohol use: Yes    Comment: occassionally  . Drug use: No    Home Medications Prior to Admission medications   Not on File    Allergies    Raspberry  Review of Systems   Review of Systems  Constitutional: Negative for fever.  Cardiovascular: Negative for chest pain.  Gastrointestinal: Positive for nausea and vomiting. Negative for abdominal pain.  Genitourinary: Positive for difficulty urinating and flank pain.  All other systems reviewed and are negative.   Physical Exam Updated Vital Signs BP (!) 143/75   Pulse 83   Temp 98.2 F (36.8 C) (Oral)   Resp (!) 22   Ht 1.74 m (5' 8.5")   Wt 112.9 kg   SpO2 99%   BMI 37.29 kg/m   Physical Exam CONSTITUTIONAL: Well developed/well nourished, uncomfortable appearing HEAD: Normocephalic/atraumatic EYES: EOMI/PERRL ENMT: Mucous membranes moist NECK: supple no meningeal signs SPINE/BACK:entire spine nontender CV: S1/S2 noted, no murmurs/rubs/gallops noted LUNGS: Lungs are clear to auscultation bilaterally, no apparent distress ABDOMEN: soft, nontender, no rebound or guarding, bowel sounds noted throughout abdomen, obese GU: Right cva tenderness, no bruising or erythema NEURO: Pt is awake/alert/appropriate, moves all extremitiesx4.  No facial droop.   EXTREMITIES: pulses normal/equal, full ROM SKIN: warm, color normal PSYCH: Anxious  ED Results / Procedures / Treatments   Labs (all labs ordered are listed, but only abnormal results are displayed)  Labs Reviewed  URINALYSIS, ROUTINE W REFLEX MICROSCOPIC - Abnormal; Notable for the following components:      Result Value   APPearance HAZY (*)    Specific Gravity, Urine >1.030 (*)    Hgb urine dipstick LARGE (*)    Protein, ur 30 (*)    All other components within normal limits  BASIC METABOLIC PANEL - Abnormal; Notable for the following components:   Glucose, Bld 245 (*)     Creatinine, Ser 1.08 (*)    Calcium 8.7 (*)    All other components within normal limits  CBC WITH DIFFERENTIAL/PLATELET - Abnormal; Notable for the following components:   WBC 14.5 (*)    Neutro Abs 10.4 (*)    All other components within normal limits  URINALYSIS, MICROSCOPIC (REFLEX) - Abnormal; Notable for the following components:   Bacteria, UA FEW (*)    All other components within normal limits  HCG, QUANTITATIVE, PREGNANCY    EKG None  Radiology CT Renal Stone Study  Result Date: 11/26/2020 CLINICAL DATA:  Flank pain with kidney stone suspected EXAM: CT ABDOMEN AND PELVIS WITHOUT CONTRAST TECHNIQUE: Multidetector CT imaging of the abdomen and pelvis was performed following the standard protocol without IV contrast. COMPARISON:  None. FINDINGS: Lower chest:  No contributory findings. Hepatobiliary: No focal liver abnormality.Cholecystectomy. No bile duct dilatation. Pancreas: Unremarkable. Spleen: Unremarkable. Adrenals/Urinary Tract: Negative adrenals. Right hydroureteronephrosis and low-density renal expansion with perinephric stranding secondary to a 3 mm stone just above the UVJ. Two right lower pole renal calculi measuring up to 6 mm. 2 adjacent left lower pole calculi measuring 3 mm each. Unremarkable bladder. Stomach/Bowel:  No obstruction. No appendicitis. Vascular/Lymphatic: No acute vascular abnormality. No mass or adenopathy. Reproductive:No pathologic findings. Other: No ascites or pneumoperitoneum. Musculoskeletal: No acute abnormalities. Lower thoracic left paracentral chronic disc herniations with buttressing osteophytes. IMPRESSION: 1. Obstructing 3 mm stone at the right UVJ. 2. Bilateral nephrolithiasis. Electronically Signed   By: Marnee Spring M.D.   On: 11/26/2020 04:05    Procedures Procedures   Medications Ordered in ED Medications  fentaNYL (SUBLIMAZE) injection 100 mcg (100 mcg Intravenous Given 11/26/20 0235)  ondansetron (ZOFRAN) injection 4 mg (4 mg  Intravenous Given 11/26/20 0233)  lactated ringers bolus 1,000 mL (0 mLs Intravenous Stopped 11/26/20 0424)  ketorolac (TORADOL) 30 MG/ML injection 30 mg (30 mg Intravenous Given 11/26/20 0317)  fentaNYL (SUBLIMAZE) injection 50 mcg (50 mcg Intravenous Given 11/26/20 0421)  sodium chloride 0.9 % bolus 1,000 mL (0 mLs Intravenous Stopped 11/26/20 4034)    ED Course  I have reviewed the triage vital signs and the nursing notes.  Pertinent labs & imaging results that were available during my care of the patient were reviewed by me and considered in my medical decision making (see chart for details).    MDM Rules/Calculators/A&P                           This patient presents to the ED for concern of flank pain, this involves an extensive number of treatment options, and is a complaint that carries with it a high risk of complications and morbidity.  The differential diagnosis includes kidney stone, pyonephritis, UTI, ovarian cyst, ectopic pregnancy   Lab Tests:   I Ordered, reviewed, and interpreted labs, which included urinalysis, pregnancy test, complete blood count, metabolic panel  Medicines ordered:   I ordered medication fentanyl and Toradol for pain  Imaging Studies ordered:   I ordered  imaging studies which included CT renal   I independently visualized and interpreted imaging which showed 3 mm right ureteral stone  Additional history obtained:   Additional history obtained from significant other  Previous records obtained and reviewed    Reevaluation:  After the interventions stated above, I reevaluated the patient and found patient is improved, taking p.o. fluids  6:38 AM Patient found to have her first kidney stone.  She was advised that she has multiple other ones embedded in the kidney will likely cause issues later.  No signs of UTI, pain is controlled, no vomiting. Short course of pain medicine and anti-nausea medicine Discussed follow-up plan as well as  strict return precautions.  Patient and significant other agree with plan Final Clinical Impression(s) / ED Diagnoses Final diagnoses:  Ureteral colic  Ureteral stone    Rx / DC Orders ED Discharge Orders         Ordered    ondansetron (ZOFRAN ODT) 8 MG disintegrating tablet  Every 8 hours PRN        11/26/20 0633    HYDROcodone-acetaminophen (NORCO/VICODIN) 5-325 MG tablet  Every 6 hours PRN        11/26/20 1610           Zadie Rhine, MD 11/26/20 [phone]

## 2020-11-26 NOTE — ED Triage Notes (Signed)
Pt reports right flank pain with vomiting x 3 episodes. Pt states she has been unable to urinate

## 2020-11-26 NOTE — ED Notes (Signed)
ED Provider at bedside. 

## 2020-11-26 NOTE — ED Notes (Signed)
Pt given sprite and warm blanket  Significant other remains at bedside

## 2020-12-01 ENCOUNTER — Emergency Department (HOSPITAL_BASED_OUTPATIENT_CLINIC_OR_DEPARTMENT_OTHER)
Admission: EM | Admit: 2020-12-01 | Discharge: 2020-12-01 | Disposition: A | Discharge status: Home / Self Care | Payer: Medicaid Other | Attending: Emergency Medicine | Admitting: Emergency Medicine

## 2020-12-01 ENCOUNTER — Other Ambulatory Visit: Payer: Self-pay

## 2020-12-01 ENCOUNTER — Other Ambulatory Visit (HOSPITAL_COMMUNITY): Payer: Self-pay | Admitting: Emergency Medicine

## 2020-12-01 ENCOUNTER — Encounter (HOSPITAL_BASED_OUTPATIENT_CLINIC_OR_DEPARTMENT_OTHER): Payer: Self-pay

## 2020-12-01 DIAGNOSIS — F17210 Nicotine dependence, cigarettes, uncomplicated: Secondary | ICD-10-CM | POA: Insufficient documentation

## 2020-12-01 DIAGNOSIS — N201 Calculus of ureter: Secondary | ICD-10-CM

## 2020-12-01 DIAGNOSIS — N200 Calculus of kidney: Secondary | ICD-10-CM

## 2020-12-01 DIAGNOSIS — R112 Nausea with vomiting, unspecified: Secondary | ICD-10-CM

## 2020-12-01 DIAGNOSIS — Z79899 Other long term (current) drug therapy: Secondary | ICD-10-CM | POA: Insufficient documentation

## 2020-12-01 DIAGNOSIS — E1169 Type 2 diabetes mellitus with other specified complication: Secondary | ICD-10-CM | POA: Insufficient documentation

## 2020-12-01 DIAGNOSIS — N202 Calculus of kidney with calculus of ureter: Secondary | ICD-10-CM | POA: Insufficient documentation

## 2020-12-01 DIAGNOSIS — I10 Essential (primary) hypertension: Secondary | ICD-10-CM | POA: Insufficient documentation

## 2020-12-01 LAB — CBC WITH DIFFERENTIAL/PLATELET
Abs Immature Granulocytes: 0.04 10*3/uL (ref 0.00–0.07)
Basophils Absolute: 0 10*3/uL (ref 0.0–0.1)
Basophils Relative: 0 %
Eosinophils Absolute: 0.3 10*3/uL (ref 0.0–0.5)
Eosinophils Relative: 3 %
HCT: 36.6 % (ref 36.0–46.0)
Hemoglobin: 12.6 g/dL (ref 12.0–15.0)
Immature Granulocytes: 0 %
Lymphocytes Relative: 20 %
Lymphs Abs: 2.2 10*3/uL (ref 0.7–4.0)
MCH: 29.8 pg (ref 26.0–34.0)
MCHC: 34.4 g/dL (ref 30.0–36.0)
MCV: 86.5 fL (ref 80.0–100.0)
Monocytes Absolute: 0.7 10*3/uL (ref 0.1–1.0)
Monocytes Relative: 7 %
Neutro Abs: 7.6 10*3/uL (ref 1.7–7.7)
Neutrophils Relative %: 70 %
Platelets: 235 10*3/uL (ref 150–400)
RBC: 4.23 MIL/uL (ref 3.87–5.11)
RDW: 12.2 % (ref 11.5–15.5)
WBC: 11 10*3/uL — ABNORMAL HIGH (ref 4.0–10.5)
nRBC: 0 % (ref 0.0–0.2)

## 2020-12-01 LAB — LIPASE, BLOOD: Lipase: 49 U/L (ref 11–51)

## 2020-12-01 LAB — COMPREHENSIVE METABOLIC PANEL
ALT: 15 U/L (ref 0–44)
AST: 17 U/L (ref 15–41)
Albumin: 3.9 g/dL (ref 3.5–5.0)
Alkaline Phosphatase: 53 U/L (ref 38–126)
Anion gap: 9 (ref 5–15)
BUN: 13 mg/dL (ref 6–20)
CO2: 24 mmol/L (ref 22–32)
Calcium: 8.8 mg/dL — ABNORMAL LOW (ref 8.9–10.3)
Chloride: 103 mmol/L (ref 98–111)
Creatinine, Ser: 1.14 mg/dL — ABNORMAL HIGH (ref 0.44–1.00)
GFR, Estimated: >60 mL/min (ref >60)
Glucose, Bld: 169 mg/dL — ABNORMAL HIGH (ref 70–99)
Potassium: 4.2 mmol/L (ref 3.5–5.1)
Sodium: 136 mmol/L (ref 135–145)
Total Bilirubin: 0.4 mg/dL (ref 0.3–1.2)
Total Protein: 7.5 g/dL (ref 6.5–8.1)

## 2020-12-01 LAB — CBG MONITORING, ED: Glucose-Capillary: 169 mg/dL — ABNORMAL HIGH (ref 70–99)

## 2020-12-01 MED ORDER — ONDANSETRON HCL 4 MG/2ML IJ SOLN
4.0000 mg | Freq: Once | INTRAMUSCULAR | Status: AC
  Administered 2020-12-01: 4 mg via INTRAVENOUS
  Filled 2020-12-01: qty 2

## 2020-12-01 MED ORDER — SODIUM CHLORIDE 0.9 % IV SOLN: Freq: Once | INTRAVENOUS | Status: AC

## 2020-12-01 MED ORDER — HYDROMORPHONE HCL 1 MG/ML IJ SOLN
0.5000 mg | Freq: Once | INTRAMUSCULAR | Status: AC
  Administered 2020-12-01: 0.5 mg via INTRAVENOUS
  Filled 2020-12-01: qty 1

## 2020-12-01 MED ORDER — HYDROCODONE-ACETAMINOPHEN 5-325 MG PO TABS: 1.0000 | ORAL_TABLET | Freq: Four times a day (QID) | ORAL | 0 refills | Status: DC | PRN

## 2020-12-01 MED ORDER — TAMSULOSIN HCL 0.4 MG PO CAPS: 0.4000 mg | ORAL_CAPSULE | Freq: Every day | ORAL | 0 refills | Status: AC

## 2020-12-01 MED ORDER — MORPHINE SULFATE (PF) 4 MG/ML IV SOLN
4.0000 mg | Freq: Once | INTRAVENOUS | Status: AC
  Administered 2020-12-01: 4 mg via INTRAVENOUS
  Filled 2020-12-01: qty 1

## 2020-12-01 MED FILL — TAMSULOSIN HCL 0.4 MG CAP: 0.4 | 21 days supply | Qty: 21 | Fill #0

## 2020-12-01 MED FILL — HYDROCODON-APAP 5-325: 5-325 | 3 days supply | Qty: 12 | Fill #0

## 2020-12-01 NOTE — Discharge Instructions (Addendum)
Please read the attached information on kidney stones.  I have given you an extended course of hydrocodone.  I have also prescribed you a medicine that should help you pass the stone.  Please follow-up with your urologist that you were given information for a prior visit.

## 2020-12-01 NOTE — ED Triage Notes (Signed)
Pt has known kidney stones, was seen here on 2/16. States has been vomiting since. Medications not working.

## 2020-12-01 NOTE — ED Provider Notes (Signed)
[facility] EMERGENCY DEPARTMENT Provider Note   CSN: [id number] Arrival date & time: 12/01/20  1025     History Chief Complaint  Patient presents with  . Emesis    [name] is a 30 y.o. female.  HPI Patient is a 30 year old female with no pertinent past medical history apart from DM, HTN, morbid obesity  Patient is presented today with continued right flank pain she was diagnosed 2/16 with a 3 mm UVJ stone.  She was told to follow-up with urology she has not follow-up with urology yet.  She states that she has been taking Zofran and still had difficulty keeping anything down.  She denies any new symptoms.  She states that her pain continues to be severe, present more than it is absent.  She states that she had a little bit of blood in her urine last week when she came to the ER but states that that has gone away.  She has been straining her urine and states that she has not had any kidney stones passed yet.  Denies any fevers or chills.  No other associate symptoms.     Past Medical History:  Diagnosis Date  . Diabetes mellitus without complication (HCC)   . Hypertension    age 20  . Migraine   . Morbid obesity (HCC)   . Ovarian cyst     Patient Active Problem List   Diagnosis Date Noted  . 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 SIRS (systemic inflammatory response syndrome) (HCC) 04/06/2015  . Hyperglycemia 04/06/2015  . Nausea and vomiting 04/06/2015  . Migraine 04/06/2015  . Essential hypertension 04/06/2015  . Syncope 04/06/2015    Past Surgical History:  Procedure Laterality Date  . CHOLECYSTECTOMY    . LUMBAR LAMINECTOMY/DECOMPRESSION MICRODISCECTOMY Left 06/26/2019   Procedure: THORACIC TWELVE-LUMBAR ONE LAMINECTOMY/DECOMPRESSION MICRODISCECTOMY   T12-L1 left;  Surgeon: Julio Sicks, MD;  Location: [facility] OR;  Service: Neurosurgery;  Laterality: Left;  . TUBES REMOVED       OB History   No  obstetric history on file.     Family History  Problem Relation Age of Onset  . Hypertension Mother     Social History   Tobacco Use  . Smoking status: Current Every Day Smoker    Packs/day: 1.00    Types: Cigarettes  . Smokeless tobacco: Never Used  Substance Use Topics  . Alcohol use: Yes    Comment: occassionally  . Drug use: No    Home Medications Prior to Admission medications   Medication Sig Start Date End Date Taking? Authorizing Provider  tamsulosin (FLOMAX) 0.4 MG CAPS capsule Take 1 capsule (0.4 mg total) by mouth daily after supper for 21 days. 12/01/20 12/22/20 Yes Fondaw, Stevphen Meuse S, PA  HYDROcodone-acetaminophen (NORCO/VICODIN) 5-325 MG tablet Take 1 tablet by mouth every 6 (six) hours as needed for severe pain. 12/01/20   Gailen Shelter, PA  ondansetron (ZOFRAN ODT) 8 MG disintegrating tablet Take 1 tablet (8 mg total) by mouth every 8 (eight) hours as needed. 11/26/20   Zadie Rhine, MD    Allergies    Raspberry  Review of Systems   Review of Systems  Constitutional: Negative for chills and fever.  HENT: Negative for congestion.   Eyes: Negative for pain.  Respiratory: Negative for cough and shortness of breath.   Cardiovascular: Negative for chest pain and leg swelling.  Gastrointestinal: Positive for abdominal pain, nausea and vomiting. Negative for diarrhea.  Genitourinary: Negative  for dysuria.  Musculoskeletal: Negative for myalgias.  Skin: Negative for rash.  Neurological: Negative for dizziness and headaches.    Physical Exam Updated Vital Signs BP (!) 152/94 (BP Location: Left Arm)   Pulse 81   Temp 98.3 F (36.8 C) (Oral)   Resp 18   Ht 5' 8.5" (1.74 m)   Wt 113 kg   LMP 10/27/2020   SpO2 98%   BMI 37.33 kg/m   Physical Exam Vitals and nursing note reviewed.  Constitutional:      General: She is not in acute distress. HENT:     Head: Normocephalic and atraumatic.     Nose: Nose normal.     Mouth/Throat:     Mouth: Mucous  membranes are moist.  Eyes:     General: No scleral icterus. Cardiovascular:     Rate and Rhythm: Normal rate and regular rhythm.     Pulses: Normal pulses.     Heart sounds: Normal heart sounds.  Pulmonary:     Effort: Pulmonary effort is normal. No respiratory distress.     Breath sounds: No wheezing.  Abdominal:     Palpations: Abdomen is soft.     Tenderness: There is abdominal tenderness. There is right CVA tenderness. There is no left CVA tenderness, guarding or rebound.     Comments: Suprapubic TTP mild No rebound or guarding   Musculoskeletal:     Cervical back: Normal range of motion.     Right lower leg: No edema.     Left lower leg: No edema.  Skin:    General: Skin is warm and dry.     Capillary Refill: Capillary refill takes less than 2 seconds.  Neurological:     Mental Status: She is alert. Mental status is at baseline.  Psychiatric:        Mood and Affect: Mood normal.        Behavior: Behavior normal.     ED Results / Procedures / Treatments   Labs (all labs ordered are listed, but only abnormal results are displayed) Labs Reviewed  CBC WITH DIFFERENTIAL/PLATELET - Abnormal; Notable for the following components:      Result Value   WBC 11.0 (*)    All other components within normal limits  COMPREHENSIVE METABOLIC PANEL - Abnormal; Notable for the following components:   Glucose, Bld 169 (*)    Creatinine, Ser 1.14 (*)    Calcium 8.8 (*)    All other components within normal limits  CBG MONITORING, ED - Abnormal; Notable for the following components:   Glucose-Capillary 169 (*)    All other components within normal limits  LIPASE, BLOOD    EKG None  Radiology No results found.  Procedures Procedures   Medications Ordered in ED Medications  0.9 %  sodium chloride infusion ( Intravenous Stopped 12/01/20 1256)  morphine 4 MG/ML injection 4 mg (4 mg Intravenous Given 12/01/20 1207)  ondansetron (ZOFRAN) injection 4 mg (4 mg Intravenous Given  12/01/20 1206)  HYDROmorphone (DILAUDID) injection 0.5 mg (0.5 mg Intravenous Given 12/01/20 1255)    ED Course  I have reviewed the triage vital signs and the nursing notes.  Pertinent labs & imaging results that were available during my care of the patient were reviewed by me and considered in my medical decision making (see chart for details).  Patient is a 30 year old female with past medical history noted in HPI notably she was diagnosed with a right-sided UVJ stone 2/16.  She is here with  continued pain and episodes of nausea and vomiting.  She was given IV Zofran and morphine.  She is p.o. challenged and was successfully able to keep down fluids. Patient was given 1 L of IV fluids and additional dose of pain medicine in the form of Dilaudid 0.5   Clinical Course as of 12/01/20 1354  Mon Dec 01, 2020  1249 CBC with no significant leukocytosis she does have a mild leukocytosis of 11 this is likely from vomiting.  It is significantly improved from her lab work done last week. CMP with somewhat elevated blood sugar.  Creatinine without any second change from several days ago.  Lipase within normal limits.  Patient feels significantly improved after IV medication.  Discharged with close follow-up with urology. [WF]    Clinical Course User Index [WF] Gailen Shelter, [place]   MDM Rules/Calculators/A&P                          Patient agreeable to plan. Given return precautions.  Is tolerating p.o.  Is ambulatory.  Pain will be controlled with Percocet every 6 hours she has only taking at night so far.  Provided her with some additional pain medicine as well as tamsulosin.  She will follow up with urology. Final Clinical Impression(s) / ED Diagnoses Final diagnoses:  Ureterolithiasis  Non-intractable vomiting with nausea, unspecified vomiting type  Kidney stone    Rx / DC Orders ED Discharge Orders         Ordered    HYDROcodone-acetaminophen (NORCO/VICODIN) 5-325 MG tablet  Every  6 hours PRN        12/01/20 1253    tamsulosin (FLOMAX) 0.4 MG CAPS capsule  Daily after supper        12/01/20 1254           Gailen Shelter, [place] 12/01/20 1445    Virgina Norfolk, DO 12/02/20 [phone]

## 2020-12-06 ENCOUNTER — Other Ambulatory Visit (HOSPITAL_COMMUNITY): Payer: Self-pay | Admitting: Urology

## 2020-12-08 MED FILL — CEFDINIR 300 MG CAPSULE: 300 | 5 days supply | Qty: 5 | Fill #0

## 2020-12-08 MED FILL — OXYBUTYNIN CHLORIDE 5 MG TA: 5 | 6 days supply | Qty: 20 | Fill #0

## 2020-12-08 MED FILL — oxyCODONE HCL 5 MG TABS: 5 | 4 days supply | Qty: 20 | Fill #0

## 2021-05-21 ENCOUNTER — Other Ambulatory Visit (HOSPITAL_BASED_OUTPATIENT_CLINIC_OR_DEPARTMENT_OTHER): Payer: Self-pay

## 2021-05-21 MED ORDER — HYDROCODONE-ACETAMINOPHEN 7.5-325 MG PO TABS
ORAL_TABLET | ORAL | 0 refills | Status: AC
  Filled 2021-05-21: qty 20, 5d supply, fill #0

## 2021-05-21 MED ORDER — POTASSIUM CITRATE ER 15 MEQ (1620 MG) PO TBCR
EXTENDED_RELEASE_TABLET | ORAL | 3 refills | Status: DC
  Filled 2021-05-21: qty 360, 90d supply, fill #0

## 2021-05-21 MED ORDER — ONDANSETRON HCL 4 MG PO TABS
ORAL_TABLET | ORAL | 1 refills | Status: DC
  Filled 2021-05-21: qty 4, 1d supply, fill #0

## 2021-05-22 ENCOUNTER — Other Ambulatory Visit (HOSPITAL_BASED_OUTPATIENT_CLINIC_OR_DEPARTMENT_OTHER): Payer: Self-pay

## 2021-06-05 ENCOUNTER — Other Ambulatory Visit (HOSPITAL_BASED_OUTPATIENT_CLINIC_OR_DEPARTMENT_OTHER): Payer: Self-pay

## 2021-06-05 MED ORDER — TAMSULOSIN HCL 0.4 MG PO CAPS
ORAL_CAPSULE | ORAL | 0 refills | Status: DC
  Filled 2021-06-05: qty 14, 14d supply, fill #0

## 2021-06-08 ENCOUNTER — Other Ambulatory Visit (HOSPITAL_BASED_OUTPATIENT_CLINIC_OR_DEPARTMENT_OTHER): Payer: Self-pay

## 2022-07-14 IMAGING — CT CT RENAL STONE PROTOCOL
2 of 4 series · 17 of 46 positions shown, 19 images · non-contrast
Comparison: None.

CLINICAL DATA: Flank pain with kidney stone suspected

EXAM:
CT ABDOMEN AND PELVIS WITHOUT CONTRAST
TECHNIQUE: Multidetector CT imaging of the abdomen and pelvis was performed
following the standard protocol without IV contrast.

[Series 2: axial st · axial · 0.98mm/px · z∈[+587,+1077]mm · 14 of 108 slices shown, 16 images]
[im 5/108  soft-tissue]
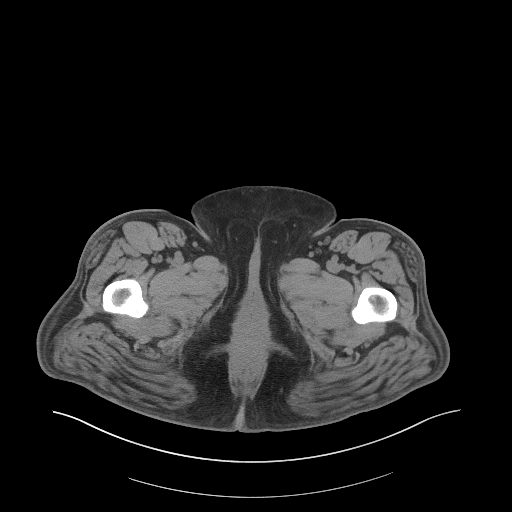
[im 5/108  bone]
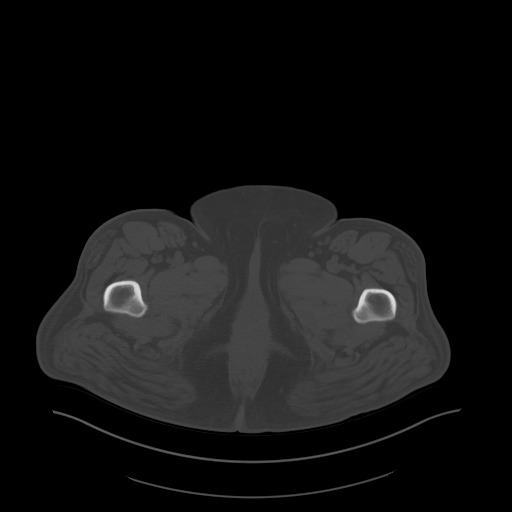
[im 14/108  soft-tissue]
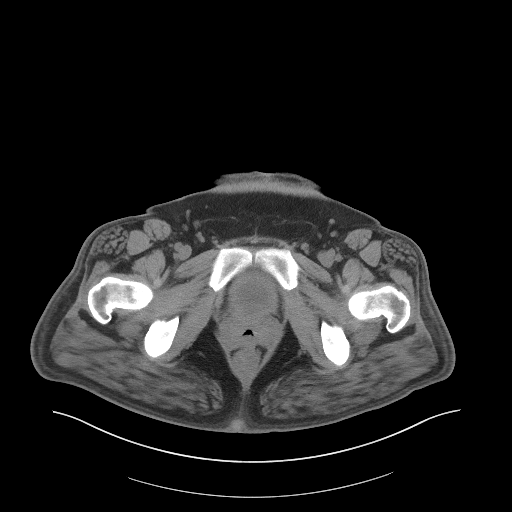
[im 23/108  soft-tissue]
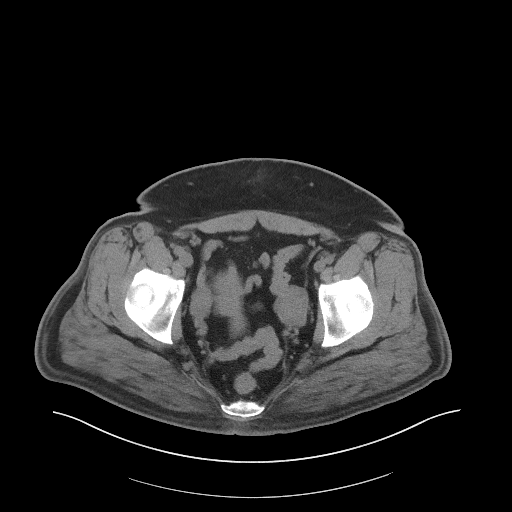
[im 27/108  soft-tissue]
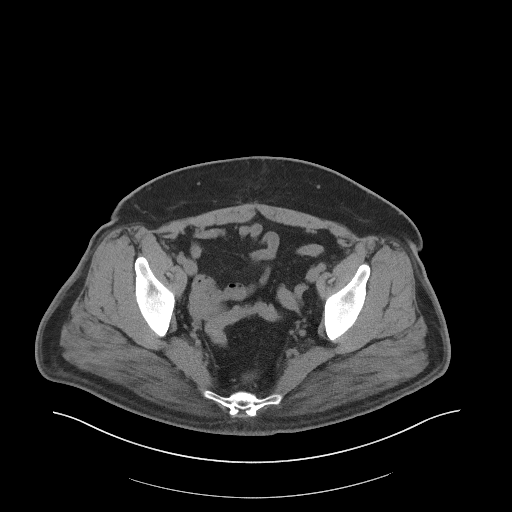
[im 36/108  soft-tissue]
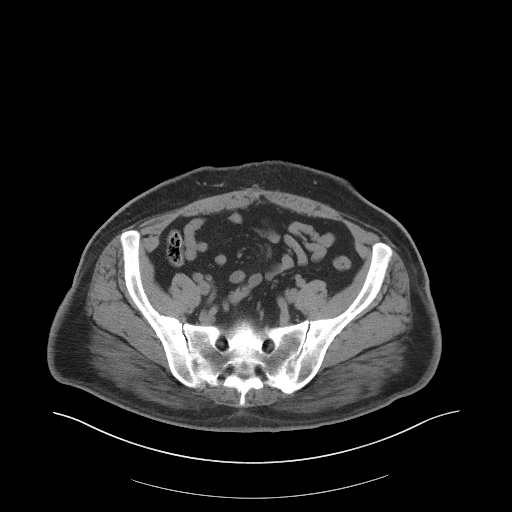
[im 45/108  soft-tissue]
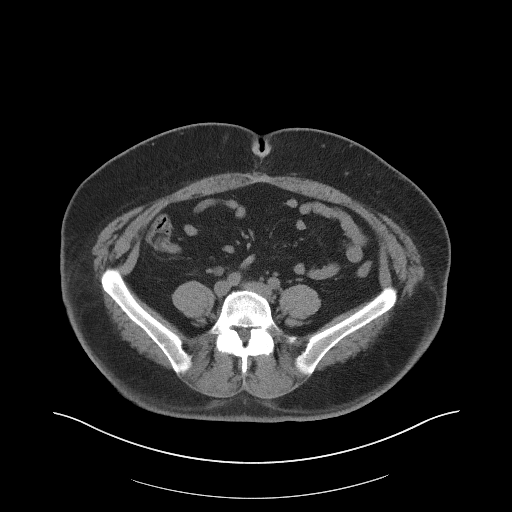
[im 50/108  soft-tissue]
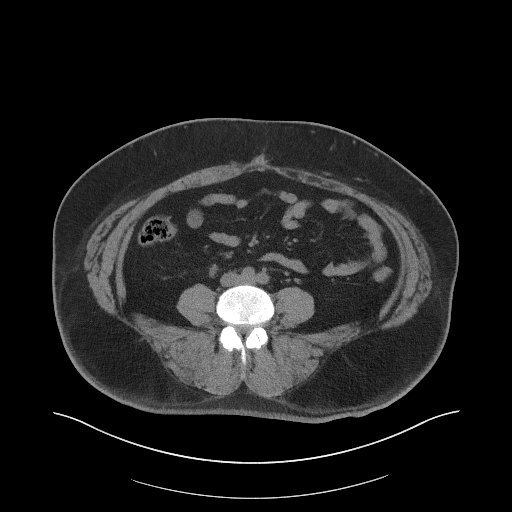
[im 58/108  soft-tissue]
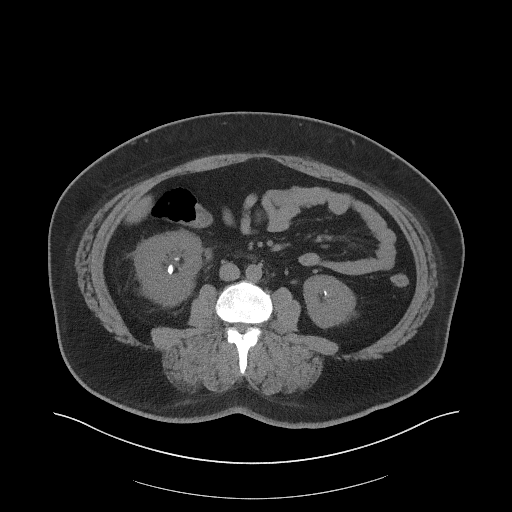
[im 63/108  soft-tissue]
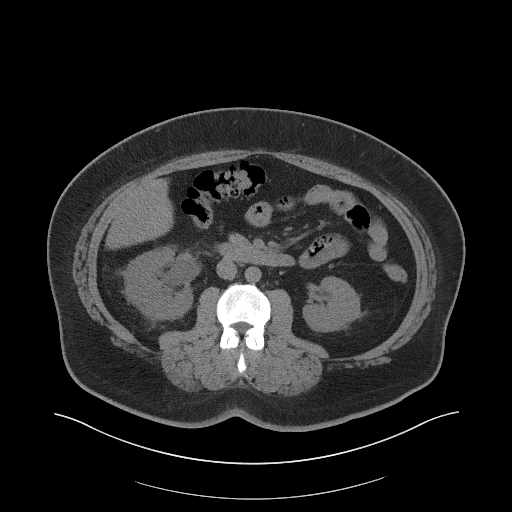
[im 63/108  bone]
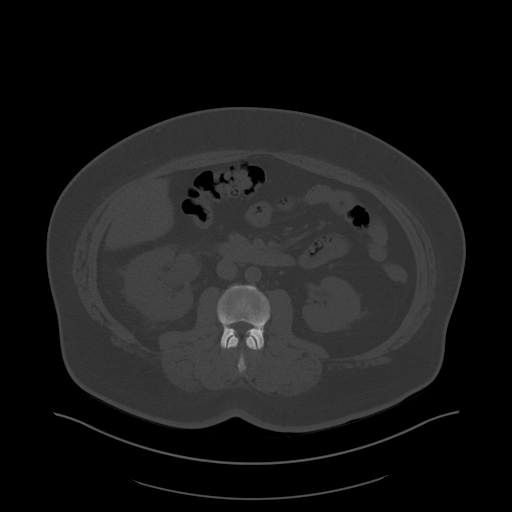
[im 72/108  soft-tissue]
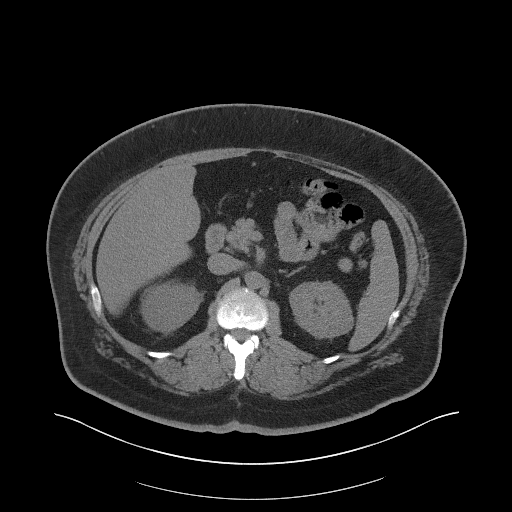
[im 81/108  soft-tissue]
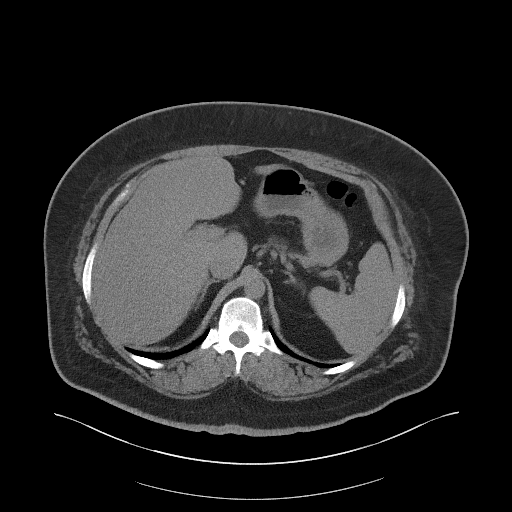
[im 85/108  soft-tissue]
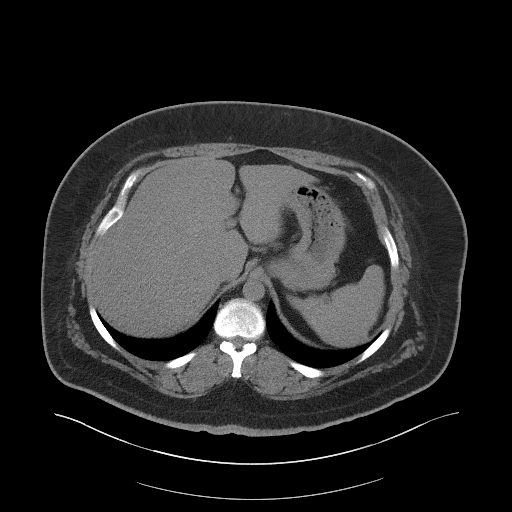
[im 94/108  soft-tissue]
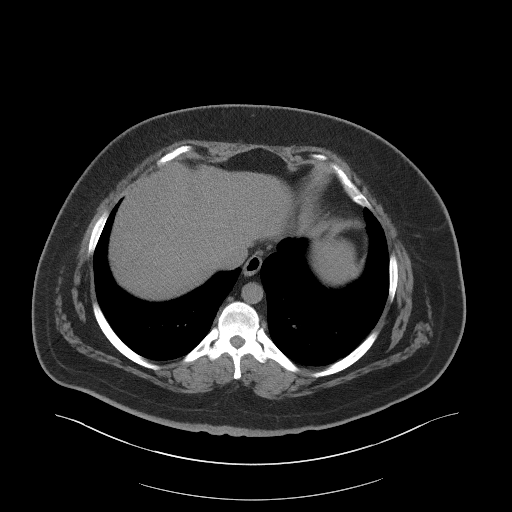
[im 103/108  soft-tissue]
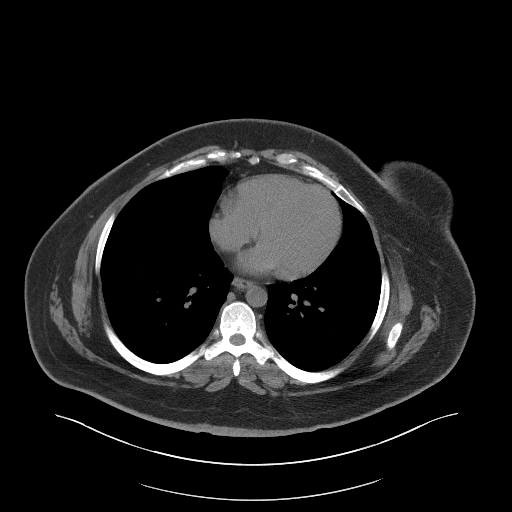

[Series 5: coronal st · coronal · 0.89mm/px · 3 of 107 slices shown]
[im 36/107  soft-tissue]
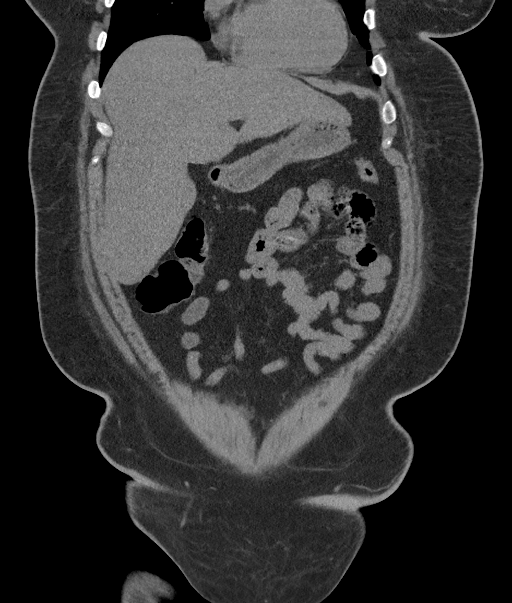
[im 48/107  soft-tissue]
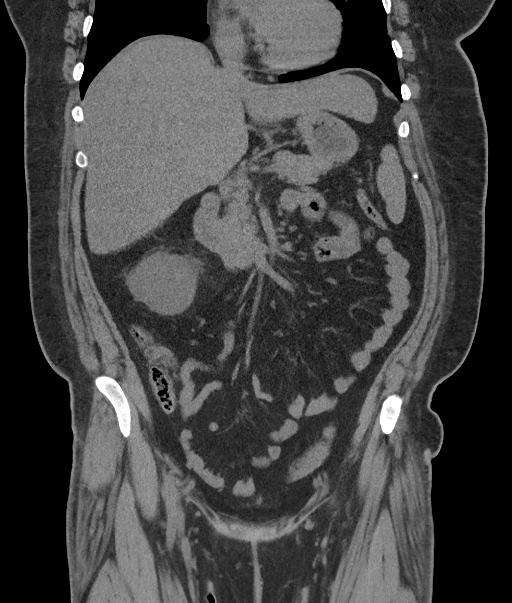
[im 59/107  soft-tissue]
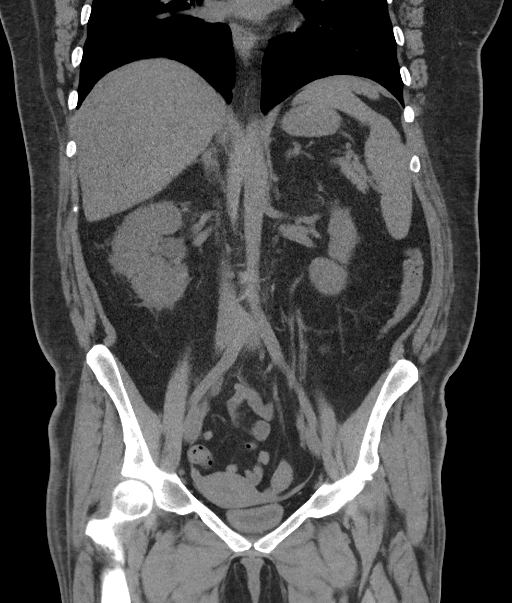

[17 of 46 positions shown; findings below may reference images not displayed]

FINDINGS: Lower chest:  No contributory findings.

Hepatobiliary: No focal liver abnormality.Cholecystectomy. No bile
duct dilatation.

Pancreas: Unremarkable.

Spleen: Unremarkable.

Adrenals/Urinary Tract: Negative adrenals. Right
hydroureteronephrosis and low-density renal expansion with
perinephric stranding secondary to a 3 mm stone just above the UVJ.
Two right lower pole renal calculi measuring up to 6 mm. 2 adjacent
left lower pole calculi measuring 3 mm each. Unremarkable bladder.

Stomach/Bowel:  No obstruction. No appendicitis.

Vascular/Lymphatic: No acute vascular abnormality. No mass or
adenopathy.

Reproductive:No pathologic findings.

Other: No ascites or pneumoperitoneum.

Musculoskeletal: No acute abnormalities. Lower thoracic left
paracentral chronic disc herniations with buttressing osteophytes.
IMPRESSION: 1. Obstructing 3 mm stone at the right UVJ.
2. Bilateral nephrolithiasis.

## 2022-11-02 ENCOUNTER — Encounter (HOSPITAL_BASED_OUTPATIENT_CLINIC_OR_DEPARTMENT_OTHER): Payer: Self-pay

## 2022-11-02 ENCOUNTER — Telehealth: Payer: Self-pay | Admitting: Surgery

## 2022-11-02 ENCOUNTER — Other Ambulatory Visit: Payer: Self-pay

## 2022-11-02 ENCOUNTER — Emergency Department (HOSPITAL_BASED_OUTPATIENT_CLINIC_OR_DEPARTMENT_OTHER)
Admission: EM | Admit: 2022-11-02 | Discharge: 2022-11-02 | Disposition: A | Discharge status: Home / Self Care | Payer: Medicaid Other | Attending: Emergency Medicine | Admitting: Emergency Medicine

## 2022-11-02 DIAGNOSIS — M5126 Other intervertebral disc displacement, lumbar region: Secondary | ICD-10-CM

## 2022-11-02 DIAGNOSIS — M545 Low back pain, unspecified: Secondary | ICD-10-CM

## 2022-11-02 DIAGNOSIS — M549 Dorsalgia, unspecified: Secondary | ICD-10-CM | POA: Diagnosis present

## 2022-11-02 LAB — CBG MONITORING, ED: Glucose-Capillary: 214 mg/dL — ABNORMAL HIGH (ref 70–99)

## 2022-11-02 MED ORDER — CYCLOBENZAPRINE HCL 10 MG PO TABS: 10.0000 mg | ORAL_TABLET | Freq: Two times a day (BID) | ORAL | 0 refills | Status: DC | PRN

## 2022-11-02 MED ORDER — OXYCODONE HCL 5 MG PO TABS
5.0000 mg | ORAL_TABLET | Freq: Once | ORAL | Status: AC
  Administered 2022-11-02: 5 mg via ORAL
  Filled 2022-11-02: qty 1

## 2022-11-02 MED ORDER — OXYCODONE HCL 5 MG PO TABS: 5.0000 mg | ORAL_TABLET | Freq: Four times a day (QID) | ORAL | 0 refills | Status: DC | PRN

## 2022-11-02 NOTE — Telephone Encounter (Signed)
OP PT recommended, Referral placed for [facility] on [employer]. Information placed on AVS with details. Updated EDP

## 2022-11-02 NOTE — ED Triage Notes (Signed)
Pt reports numbness to entire left leg and states she also has pain across her lower back, onset "a couple of weeks ago." She went to [facility] last Tuesday and had a MRI and was told she wasn't given much information regarding the results. She states the pain is worse and she is unable to stand up/ hx of back surgery in 2020.

## 2022-11-02 NOTE — ED Provider Notes (Signed)
[facility] Provider Note   CSN: [id number] Arrival date & time: 11/02/22  1809     History  Chief Complaint  Patient presents with   Back Pain    [name] is a 32 y.o. female.  Patient here with ongoing back pain.  Chronic back pain.  History of back surgery.  Had MRI couple days ago that showed herniated disc.  She had been previously on steroids but it was causing her blood sugar to be high.  She has been on narcotic pain medicine intermittently.  She has been unable to follow-up with her spine surgery because she owes money to the group.  Spine surgery group that she was referred to otherwise will except her insurance.  She has not done physical therapy.  She denies any loss of bowel or bladder.  Pain worse with ambulation.  But she denies any weakness or numbness.  The history is provided by the patient.       Home Medications Prior to Admission medications   Medication Sig Start Date End Date Taking? Authorizing Provider  cyclobenzaprine (FLEXERIL) 10 MG tablet Take 1 tablet (10 mg total) by mouth 2 (two) times daily as needed for muscle spasms. 11/02/22  Yes Macon Sandiford, DO  oxyCODONE (ROXICODONE) 5 MG immediate release tablet Take 1 tablet (5 mg total) by mouth every 6 (six) hours as needed for up to 20 doses. 11/02/22  Yes Filicia Scogin, DO  HYDROcodone-acetaminophen (NORCO) 7.5-325 MG tablet Take 1 tablet by mouth every 6 (six) hours as needed for up to 20 doses for Moderate pain (4-6). 05/21/21     HYDROcodone-acetaminophen (NORCO/VICODIN) 5-325 MG tablet Take 1 tablet by mouth every 6 (six) hours as needed for severe pain. 12/01/20   Tedd Sias, PA  ondansetron (ZOFRAN ODT) 8 MG disintegrating tablet Take 1 tablet (8 mg total) by mouth every 8 (eight) hours as needed. 11/26/20   Ripley Fraise, MD  Potassium Citrate 15 MEQ (1620 MG) TBCR Take 2 tablets (30 mEq total) by mouth 2 times daily for 360 doses.  05/21/21     tamsulosin (FLOMAX) 0.4 MG CAPS capsule Take 1 capsule (0.4 mg total) by mouth daily for 14 days. 06/05/21         Allergies    Raspberry    Review of Systems   Review of Systems  Physical Exam Updated Vital Signs BP (!) 148/96   Pulse 94   Temp 98 F (36.7 C) (Oral)   Resp 20   Ht 5' 8.25" (1.734 m)   Wt 103.9 kg   LMP 10/12/2022 (Approximate)   SpO2 99%   BMI 34.56 kg/m  Physical Exam Vitals and nursing note reviewed.  Constitutional:      General: She is not in acute distress.    Appearance: She is well-developed. She is not ill-appearing.  HENT:     Head: Normocephalic and atraumatic.  Eyes:     Extraocular Movements: Extraocular movements intact.     Conjunctiva/sclera: Conjunctivae normal.     Pupils: Pupils are equal, round, and reactive to light.  Cardiovascular:     Rate and Rhythm: Normal rate and regular rhythm.     Pulses: Normal pulses.     Heart sounds: No murmur heard. Pulmonary:     Effort: Pulmonary effort is normal. No respiratory distress.     Breath sounds: Normal breath sounds.  Abdominal:     Palpations: Abdomen is soft.  Tenderness: There is no abdominal tenderness.  Musculoskeletal:        General: Tenderness present. No swelling.     Cervical back: Normal range of motion and neck supple.  Skin:    General: Skin is warm and dry.     Capillary Refill: Capillary refill takes less than 2 seconds.  Neurological:     General: No focal deficit present.     Mental Status: She is alert and oriented to person, place, and time.     Cranial Nerves: No cranial nerve deficit.     Sensory: No sensory deficit.     Motor: No weakness.     Coordination: Coordination normal.  Psychiatric:        Mood and Affect: Mood normal.     ED Results / Procedures / Treatments   Labs (all labs ordered are listed, but only abnormal results are displayed) Labs Reviewed  CBG MONITORING, ED - Abnormal; Notable for the following components:       Result Value   Glucose-Capillary 214 (*)    All other components within normal limits    EKG None  Radiology No results found.  Procedures Procedures    Medications Ordered in ED Medications  oxyCODONE (Oxy IR/ROXICODONE) immediate release tablet 5 mg (5 mg Oral Given 11/02/22 2225)    ED Course/ Medical Decision Making/ A&P                             Medical Decision Making Risk Prescription drug management.   [name] is here with acute on chronic back pain.  History of back surgeries in the past.  History of diabetes.  Per my chart review she had an MRI a couple days ago at another medical facility that showed multiple areas of degenerative disc disease.  With a L4-L5 it appears that she has a herniated disc.  She is unable to follow-up with her spine team because she has out standing payments.  She was referred to a different spine team that is unable to accept her insurance.  She has been intermittently taking narcotic pain medicine with improvement.  She denies any worsening weakness or numbness.  She has no symptoms of cauda equina.  She has no loss of bowel or bladder.  Pain mostly when she is walking.  Neurovascular neuromuscularly she is intact on exam.  Seems like today is mostly about social visit.  I was able to arrange for outpatient physical therapy after talking with case management team.  I think that this will be beneficial for her.  She needs to work with her primary care doctor about a spine team that will accept her insurance.  The spine team that she owes money to is a part of our group and I do not have a team to refer her to you.  Ultimately I think she benefit from physical therapy and weight loss.  Recommend Tylenol and ibuprofen in addition Roxicodone for breakthrough pain and Flexeril for muscle relaxant.  No other acute processes at this time.  Blood sugars unremarkable.  Discharged in good condition.  This chart was dictated using voice  recognition software.  Despite best efforts to proofread,  errors can occur which can change the documentation meaning.         Final Clinical Impression(s) / ED Diagnoses Final diagnoses:  Lumbar disc herniation    Rx / DC Orders ED Discharge Orders  Ordered    oxyCODONE (ROXICODONE) 5 MG immediate release tablet  Every 6 hours PRN        11/02/22 2247    cyclobenzaprine (FLEXERIL) 10 MG tablet  2 times daily PRN        11/02/22 2247              Virgina Norfolk, DO 11/02/22 2250

## 2022-11-09 ENCOUNTER — Encounter: Payer: Self-pay | Admitting: Physical Therapy

## 2022-11-09 ENCOUNTER — Ambulatory Visit: Payer: Medicaid Other | Attending: Emergency Medicine | Admitting: Physical Therapy

## 2022-11-09 DIAGNOSIS — M6281 Muscle weakness (generalized): Secondary | ICD-10-CM | POA: Diagnosis present

## 2022-11-09 DIAGNOSIS — R252 Cramp and spasm: Secondary | ICD-10-CM

## 2022-11-09 DIAGNOSIS — M5416 Radiculopathy, lumbar region: Secondary | ICD-10-CM | POA: Diagnosis present

## 2022-11-09 DIAGNOSIS — M5442 Lumbago with sciatica, left side: Secondary | ICD-10-CM | POA: Insufficient documentation

## 2022-11-09 DIAGNOSIS — R262 Difficulty in walking, not elsewhere classified: Secondary | ICD-10-CM | POA: Insufficient documentation

## 2022-11-09 NOTE — Therapy (Signed)
OUTPATIENT PHYSICAL THERAPY THORACOLUMBAR EVALUATION   Patient Name: [name] MRN: [id number] DOB:Jul 11, 1991, 32 y.o., female Today's Date: 11/09/2022  END OF SESSION:  PT End of Session - 11/09/22 1257     Visit Number 1    Number of Visits 12    Date for PT Re-Evaluation 12/21/22    Authorization Type [employer]    PT Start Time 1020    PT Stop Time 1105    PT Time Calculation (min) 45 min    Activity Tolerance Patient limited by pain    Behavior During Therapy WFL for tasks assessed/performed             Past Medical History:  Diagnosis Date   Diabetes mellitus without complication (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 06/26/2019   SIRS (systemic inflammatory response syndrome) (HCC) 04/06/2015   Hyperglycemia 04/06/2015   Nausea and vomiting 04/06/2015   Migraine 04/06/2015   Essential hypertension 04/06/2015   Syncope 04/06/2015    PCP: Patient, No Pcp Per  REFERRING PROVIDER: Lennice Sites, DO   REFERRING DIAG: M51.26 (ICD-10-CM) - Lumbar disc herniation  Rationale for Evaluation and Treatment: Rehabilitation  THERAPY DIAG:  Acute left-sided low back pain with left-sided sciatica  Radiculopathy, lumbar region  Muscle weakness (generalized)  Cramp and spasm  Difficulty in walking, not elsewhere classified  ONSET DATE: 10/06/2022  SUBJECTIVE:                                                                                                                                                                                            SUBJECTIVE STATEMENT: Patient reports that the pain started just after Christmas.  She went to [facility] on 10/26/22 and had MRI done, never told of results, went to ED here on 11/02/22, finally told she had a ruptured disk., referred to PT.  She still has outstanding balance from spine surgeon from emergency surgery back in 2020 for ruptured disk causing right leg pain at that time, so has not been referred to new spinal surgeon.  She said they mentioned  shots but worried that may make her worse.  She has been unable to work due to pain.  Denies bowel or bladder changes, but her whole left leg is numb.   PERTINENT HISTORY:  06/27/2019 emergency surgery for large T12-L1 disc herniation on the left side with severe cord compression (THORACIC TWELVE-LUMBAR ONE LAMINECTOMY/DECOMPRESSION MICRODISCECTOMY   T12-L1 left.), diabetes, migraine, obesity, gall bladder removal.   PAIN:  Are you having pain? Yes: NPRS scale: 9-10/10 Pain location: Left side low back shooting down left leg to foot Pain description: sharp, shooting, stabbing, numbness in L foot Aggravating factors: prolonged standing, walking, bending over, transfers Relieving factors: pain medication  PRECAUTIONS: None  WEIGHT BEARING RESTRICTIONS: No  FALLS:  Has patient fallen in last 6 months?  No falls but left leg feels numb, weak, has had to grab and catch self several times.    LIVING ENVIRONMENT: Lives with: lives with their family Lives in: House/apartment Stairs: Yes: External: 2 steps; on right going up Has following equipment at home: None  OCCUPATION: preschool teacher  PLOF: Independent  PATIENT GOALS: stop the pain, hopefully not need emergency surgery again, get back to work.   NEXT MD VISIT: none scheduled  OBJECTIVE:   DIAGNOSTIC FINDINGS:  MR lumbar spine 10/26/2022 MR LUMBAR SPINE IMPRESSION    1. At L5-S1, moderate left subarticular recess stenosis due to left  paracentral disc protrusion and left-sided synovial cyst. This also  contributes to moderate proximal left foraminal stenosis.  2. At L4-L5, mild left greater than right subarticular recess  narrowing, canal stenosis, and foraminal stenosis.  3. The T12-L1 level is described above.   PATIENT SURVEYS:  Modified Oswestry 29/50 = 58%   SCREENING FOR RED FLAGS: Bowel or bladder incontinence: No Spinal tumors: No Cauda equina syndrome: No Compression fracture: No Abdominal aneurysm: No  COGNITION: Overall cognitive status: Within functional limits for tasks assessed     SENSATION: Light touch: Impaired  no sensation in LLE except L medial calf.   MUSCLE LENGTH: NT  POSTURE: rounded shoulders and weight shift right  PALPATION: Tenderness throughout left lumbar paraspinals and glutes.   LUMBAR ROM:   AROM eval  Flexion Limited 95%  Extension Limited 95%  Right lateral flexion Limited 75%  Left lateral flexion Limited 100%  Right rotation Limited 75%  Left rotation Limited 100%   (Blank rows = not tested)  LOWER EXTREMITY ROM:   NT due to pain   LOWER EXTREMITY MMT:    MMT Right eval Left* eval  Hip flexion 5 4*  Hip extension    Hip abduction 5 4+  Hip adduction 5 4+   Hip internal rotation    Hip external rotation    Knee flexion 5 3+  Knee extension 5 4+ *  Ankle dorsiflexion 5 3+  Ankle plantarflexion 5 3+  Ankle inversion    Ankle eversion     (Blank rows = not tested) * all movements very slow and painful.   LUMBAR SPECIAL TESTS:  Increased pain/neural tension with left LAQ.  Remaining special tests deferred due to pain levels.   FUNCTIONAL TESTS:  NT due to pain.    GAIT: Distance walked: 44' Assistive device utilized: None Level of assistance: SBA Comments: extremely slow, antalgic gait with weight shift to RLE, step to gait on L.   TODAY'S TREATMENT:  DATE:   11/09/2022 Modalities: CP + Estim (premod) applied to L side low back x 5 min however discontinued due to complaint of worsening numbness in LLE.      PATIENT EDUCATION:  Education details: findings, POC, anatomy Person educated: Patient Education method: Explanation, Demonstration, and anatomy model Education comprehension: verbalized understanding  HOME EXERCISE PROGRAM: TBA  ASSESSMENT:  CLINICAL IMPRESSION: Patient is a 32 y.o. female who was seen today for physical therapy evaluation and treatment for lumbar disc herniation.  She demonstrates severe left sided low back pain with radicular symptoms down left leg to foot, numbness throughout entire LLE, and weakness throughout LLE, although this may be due to pain.   All movements increased pain today.  S Discussed different options we can trial in PT but first priority is decreasing pain as her pain was obviously increasing with even very gentle movements and MMT.  Trialed Etim + CP to left lumbar paraspinals to decrease muscle spasm, reported at first helping but then increased numbness in left leg, so immediately discontinued.  She would benefit from skilled physical therapy to centralize symptoms, decrease pain and improved QOL.  Discussed that is she does start having muscle weakness in her left leg, falling, or saddle anesthesia, or loss of bowel or bladder control she should immediately go to ER.    OBJECTIVE IMPAIRMENTS: Abnormal gait, decreased activity tolerance, decreased balance, decreased mobility, difficulty walking, decreased ROM, decreased strength, increased muscle spasms, impaired flexibility, impaired sensation, postural dysfunction, and pain.   ACTIVITY LIMITATIONS: carrying, lifting, bending, sitting, standing, sleeping, stairs, transfers, locomotion level, and caring for others  PARTICIPATION LIMITATIONS:  meal prep, cleaning, laundry, driving, shopping, community activity, and occupation  PERSONAL FACTORS: Age, Past/current experiences, and 3+ comorbidities: previous back surgery, chronic back pain, recent gall bladder removal, smoker, obesity  are also affecting patient's functional outcome.   REHAB POTENTIAL: Good  CLINICAL DECISION MAKING: Evolving/moderate complexity  EVALUATION COMPLEXITY: Moderate   GOALS: Goals reviewed with patient? Yes  SHORT TERM GOALS: Target date: 11/30/2022   Patient will be independent with initial HEP.  Baseline: needs Goal status: INITIAL  2.  Patient will report centralization of radicular symptoms.  Baseline: down left leg to foot Goal status: INITIAL   LONG TERM GOALS: Target date: 12/21/2022    Patient will be independent with advanced/ongoing HEP to improve outcomes and carryover.  Baseline: needs progression Goal status: INITIAL  2.  Patient will report 75% improvement in low back pain to improve QOL.  Baseline:  Goal status: INITIAL  3.  Patient will demonstrate full pain free lumbar ROM to perform ADLs.   Baseline: see objective, severely limited Goal status: INITIAL  4.  Patient will demonstrate improved functional strength as demonstrated by 5/5 LLE strength. Baseline: decreased LLE strength, see objective Goal status: INITIAL  5.  Patient will report < 40% impairment on Modified Oswestry to demonstrate improved functional ability.  Baseline: 58% impairment Goal status: INITIAL   6.  Patient will tolerate 15 min of standing or sitting to perform job activities. Baseline: unable Goal status: INITIAL  7.  Patient will be able to walk 600' with normal gait pattern in > 0.8 m/s for community ambulation.  Baseline: extremely slow and antalgic Goal status: INITIAL   PLAN:  PT FREQUENCY: 2x/week  PT DURATION: 6 weeks  PLANNED INTERVENTIONS: Therapeutic exercises, Therapeutic activity, Neuromuscular re-education, Balance  training, Gait training, Patient/Family education, Self Care, Joint mobilization, Stair training, Dry Needling, Electrical stimulation, Spinal mobilization, Cryotherapy, Moist  heat, Taping, Traction, Manual therapy, and Re-evaluation.  PLAN FOR NEXT SESSION: mckenzie extension exercises if tolerates (start standing), traction, modalities PRN   Rennie Natter, PT, DPT  11/09/2022, 6:43 PM

## 2022-11-11 ENCOUNTER — Ambulatory Visit: Payer: Medicaid Other | Attending: Emergency Medicine | Admitting: Physical Therapy

## 2022-11-11 ENCOUNTER — Encounter: Payer: Self-pay | Admitting: Physical Therapy

## 2022-11-11 DIAGNOSIS — M5416 Radiculopathy, lumbar region: Secondary | ICD-10-CM | POA: Insufficient documentation

## 2022-11-11 DIAGNOSIS — R262 Difficulty in walking, not elsewhere classified: Secondary | ICD-10-CM | POA: Diagnosis present

## 2022-11-11 DIAGNOSIS — M5442 Lumbago with sciatica, left side: Secondary | ICD-10-CM | POA: Diagnosis present

## 2022-11-11 DIAGNOSIS — R252 Cramp and spasm: Secondary | ICD-10-CM | POA: Diagnosis present

## 2022-11-11 DIAGNOSIS — M6281 Muscle weakness (generalized): Secondary | ICD-10-CM | POA: Insufficient documentation

## 2022-11-11 NOTE — Therapy (Signed)
OUTPATIENT PHYSICAL THERAPY THORACOLUMBAR EVALUATION   Patient Name: [name] MRN: [id number] DOB:12/19/1990, 32 y.o., female Today's Date: 11/11/2022  END OF SESSION:  PT End of Session - 11/11/22 1117     Visit Number 2    Number of Visits 12    Date for PT Re-Evaluation 12/21/22    Authorization Type [employer]    PT Start Time 1105    PT Stop Time 1150    PT Time Calculation (min) 45 min    Activity Tolerance Patient limited by pain    Behavior During Therapy WFL for tasks assessed/performed             Past Medical History:  Diagnosis Date   Diabetes mellitus without complication ([place])    Hypertension    age 89   Migraine    Morbid obesity ([place])    Ovarian cyst    Past Surgical History:  Procedure Laterality Date   CHOLECYSTECTOMY     LUMBAR LAMINECTOMY/DECOMPRESSION MICRODISCECTOMY Left 06/26/2019   Procedure: THORACIC TWELVE-LUMBAR ONE LAMINECTOMY/DECOMPRESSION MICRODISCECTOMY   T12-L1 left;  Surgeon: Earnie Larsson, MD;  Location: [facility];  Service: Neurosurgery;  Laterality: Left;   TUBES REMOVED     Patient Active Problem List   Diagnosis Date Noted   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 06/26/2019   SIRS (systemic inflammatory response syndrome) (HCC) 04/06/2015   Hyperglycemia 04/06/2015   Nausea and vomiting 04/06/2015   Migraine 04/06/2015   Essential hypertension 04/06/2015   Syncope 04/06/2015    PCP: Patient, No Pcp Per  REFERRING PROVIDER: Lennice Sites, DO   REFERRING DIAG: M51.26 (ICD-10-CM) - Lumbar disc herniation  Rationale for Evaluation and Treatment: Rehabilitation  THERAPY DIAG:  Acute left-sided low back pain with left-sided sciatica  Radiculopathy, lumbar region  Muscle weakness (generalized)  Cramp and spasm  Difficulty in walking, not elsewhere classified  ONSET DATE: 10/06/2022  SUBJECTIVE:                                                                                                                                                                                            SUBJECTIVE STATEMENT: Patient reports no improvement in pain, she also worked yesterday which really aggravated her back.  She took 800mg  ibuprofen this morning.    PERTINENT HISTORY:  06/27/2019 emergency surgery for large T12-L1 disc herniation on the left side with severe cord compression (THORACIC TWELVE-LUMBAR ONE LAMINECTOMY/DECOMPRESSION MICRODISCECTOMY   T12-L1 left.), diabetes, migraine, obesity, gall bladder removal.   PAIN:  Are you having pain? Yes: NPRS scale: 7/10 Pain location: Left side low back shooting down left leg to  foot Pain description: sharp, shooting, stabbing, numbness in L foot Aggravating factors: prolonged standing, walking, bending over, transfers Relieving factors: pain medication  PRECAUTIONS: None  WEIGHT BEARING RESTRICTIONS: No  FALLS:  Has patient fallen in last 6 months?  No falls but left leg feels numb, weak, has had to grab and catch self several times.    LIVING ENVIRONMENT: Lives with: lives with their family Lives in: House/apartment Stairs: Yes: External: 2 steps; on right going up Has following equipment at home: None  OCCUPATION: preschool teacher  PLOF: Independent  PATIENT GOALS: stop the pain, hopefully not need emergency surgery again, get back to work.   NEXT MD VISIT: none scheduled  OBJECTIVE:   DIAGNOSTIC FINDINGS:  MR lumbar spine 10/26/2022 MR LUMBAR SPINE IMPRESSION   1. At L5-S1, moderate left subarticular recess stenosis due to left  paracentral disc protrusion and left-sided synovial cyst. This also  contributes to moderate proximal left foraminal stenosis.  2. At L4-L5, mild left greater than right subarticular recess  narrowing, canal stenosis, and foraminal stenosis.  3. The T12-L1 level is described above.   PATIENT SURVEYS:  Modified Oswestry 29/50 = 58%   SCREENING FOR RED FLAGS: Bowel or bladder  incontinence: No Spinal tumors: No Cauda equina syndrome: No Compression fracture: No Abdominal aneurysm: No  COGNITION: Overall cognitive status: Within functional limits for tasks assessed     SENSATION: Light touch: Impaired  no sensation in LLE except L medial calf.   MUSCLE LENGTH: NT  POSTURE: rounded shoulders and weight shift right  PALPATION: Tenderness throughout left lumbar paraspinals and glutes.   LUMBAR ROM:   AROM eval  Flexion Limited 95%  Extension Limited 95%  Right lateral flexion Limited 75%  Left lateral flexion Limited 100%  Right rotation Limited 75%  Left rotation Limited 100%   (Blank rows = not tested)  LOWER EXTREMITY ROM:   NT due to pain   LOWER EXTREMITY MMT:    MMT Right eval Left* eval  Hip flexion 5 4*  Hip extension    Hip abduction 5 4+  Hip adduction 5 4+   Hip internal rotation    Hip external rotation    Knee flexion 5 3+  Knee extension 5 4+ *  Ankle dorsiflexion 5 3+  Ankle plantarflexion 5 3+  Ankle inversion    Ankle eversion     (Blank rows = not tested) * all movements very slow and painful.   LUMBAR SPECIAL TESTS:  Increased pain/neural tension with left LAQ.  Remaining special tests deferred due to pain levels.   FUNCTIONAL TESTS:  NT due to pain.    GAIT: Distance walked: 80' Assistive device utilized: None Level of assistance: SBA Comments: extremely slow, antalgic gait with weight shift to RLE, step to gait on L.   TODAY'S TREATMENT:  DATE:   11/11/22 Mechanical  Lumbar Traction x 20 min including set-up and steps up, 15 min treatment time at 45 lbs max 30 lbs minimum, 60 sec on/10 sec rest, 2 steps up, 2 steps down, with table at 10 deg decline, to decrease radicular symptoms and pain.  Therapeutic Exercise: to improve strength and mobility.  Demo, verbal and tactile cues  throughout for technique.  Trialed gentle exercises focusing on side of preference, unable to perform SKTC due to pain, very small movements with LTR to R ok, seated trunk rotation to R ok, small amplitude, demo only due to pain side glides, standing extension at wall (raise arms rather than try to extend back) and laying prone over 2 pillows.   Manual Therapy: to decrease muscle spasm and pain and improve mobility.  Trialed gentle LLE distraction, discontinued due to poor tolerance.    11/09/2022 Modalities: CP + Estim (premod) applied to L side low back x 5 min however discontinued due to complaint of worsening numbness in LLE.      PATIENT EDUCATION:  Education details: HEP Person educated: Patient Education method: Explanation, Media planner, and Handouts Education comprehension: verbalized understanding  HOME EXERCISE PROGRAM: Access Code: [email or web address] URL: [email or web address] Date: 11/11/2022 Prepared by: Glenetta Hew  Exercises - Lateral Shift Correction at [place]  - 3 x daily - 7 x weekly - 1 sets - 10 reps - Standing Lumbar Extension at Wall  - 3 x daily - 7 x weekly - 1 sets - 10 reps - Lying Prone with 2 Pillows  - 3 x daily - 7 x weekly - 1 sets - 3-5 min  hold  ASSESSMENT:  CLINICAL IMPRESSION: [name] continues to report severe pain and radicular symptoms down LLE, trialed traction today, tolerated well for ~ 10 min then started to report increased spasm so stopped and repositioned patient.  Tried gentle manual traction but did not tolerate.  Reviewed very gentle small amplitude movements to help alleviated spasm focusing on side of preference for HEP.  Recommended contacting other neurosurgeon offices to see if any accept Medicaid.  [name] continues to demonstrate potential for improvement and would benefit from continued skilled therapy to address impairments.      OBJECTIVE IMPAIRMENTS: Abnormal gait, decreased activity tolerance,  decreased balance, decreased mobility, difficulty walking, decreased ROM, decreased strength, increased muscle spasms, impaired flexibility, impaired sensation, postural dysfunction, and pain.   ACTIVITY LIMITATIONS: carrying, lifting, bending, sitting, standing, sleeping, stairs, transfers, locomotion level, and caring for others  PARTICIPATION LIMITATIONS: meal prep, cleaning, laundry, driving, shopping, community activity, and occupation  PERSONAL FACTORS: Age, Past/current experiences, and 3+ comorbidities: previous back surgery, chronic back pain, recent gall bladder removal, smoker, obesity  are also affecting patient's functional outcome.   REHAB POTENTIAL: Good  CLINICAL DECISION MAKING: Evolving/moderate complexity  EVALUATION COMPLEXITY: Moderate   GOALS: Goals reviewed with patient? Yes  SHORT TERM GOALS: Target date: 11/30/2022   Patient will be independent with initial HEP.  Baseline: needs Goal status: IN PROGRESS  2.  Patient will report centralization of radicular symptoms.  Baseline: down left leg to foot Goal status: IN PROGRESS   LONG TERM GOALS: Target date: 12/21/2022    Patient will be independent with advanced/ongoing HEP to improve outcomes and carryover.  Baseline: needs progression Goal status: IN PROGRESS  2.  Patient will report 75% improvement in low back pain to improve QOL.  Baseline:  Goal status: IN PROGRESS  3.  Patient will demonstrate full pain free lumbar  ROM to perform ADLs.   Baseline: see objective, severely limited Goal status: IN PROGRESS  4.  Patient will demonstrate improved functional strength as demonstrated by 5/5 LLE strength. Baseline: decreased LLE strength, see objective Goal status: IN PROGRESS  5.  Patient will report < 40% impairment on Modified Oswestry to demonstrate improved functional ability.  Baseline: 58% impairment Goal status: IN PROGRESS   6.  Patient will tolerate 15 min of standing or sitting to perform  job activities. Baseline: unable Goal status: IN PROGRESS  7.  Patient will be able to walk 600' with normal gait pattern in > 0.8 m/s for community ambulation.  Baseline: extremely slow and antalgic Goal status: IN PROGRESS   PLAN:  PT FREQUENCY: 2x/week  PT DURATION: 6 weeks  PLANNED INTERVENTIONS: Therapeutic exercises, Therapeutic activity, Neuromuscular re-education, Balance training, Gait training, Patient/Family education, Self Care, Joint mobilization, Stair training, Dry Needling, Electrical stimulation, Spinal mobilization, Cryotherapy, Moist heat, Taping, Traction, Manual therapy, and Re-evaluation.  PLAN FOR NEXT SESSION: mckenzie extension exercises if tolerates (start standing), traction, modalities PRN   Rennie Natter, PT, DPT  11/11/2022, 12:01 PM

## 2022-11-15 NOTE — Therapy (Signed)
OUTPATIENT PHYSICAL THERAPY THORACOLUMBAR EVALUATION   Patient Name: [name] MRN: [id number] DOB:02/26/1991, 32 y.o., female Today's Date: 11/16/2022  END OF SESSION:  PT End of Session - 11/16/22 1351     Visit Number 3    Number of Visits 12    Date for PT Re-Evaluation 12/21/22    Authorization Type [employer]    PT Start Time 1351    PT Stop Time 1440    PT Time Calculation (min) 49 min    Activity Tolerance Treatment limited secondary to medical complications (Comment)    Behavior During Therapy WFL for tasks assessed/performed              Past Medical History:  Diagnosis Date   Diabetes mellitus without complication ([place])    Hypertension    age 10   Migraine    Morbid obesity ([place])    Ovarian cyst    Past Surgical History:  Procedure Laterality Date   CHOLECYSTECTOMY     LUMBAR LAMINECTOMY/DECOMPRESSION MICRODISCECTOMY Left 06/26/2019   Procedure: THORACIC TWELVE-LUMBAR ONE LAMINECTOMY/DECOMPRESSION MICRODISCECTOMY   T12-L1 left;  Surgeon: Earnie Larsson, MD;  Location: [facility];  Service: Neurosurgery;  Laterality: Left;   TUBES REMOVED     Patient Active Problem List   Diagnosis Date Noted   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 06/26/2019   SIRS (systemic inflammatory response syndrome) (HCC) 04/06/2015   Hyperglycemia 04/06/2015   Nausea and vomiting 04/06/2015   Migraine 04/06/2015   Essential hypertension 04/06/2015   Syncope 04/06/2015    PCP: Patient, No Pcp Per  REFERRING PROVIDER: Lennice Sites, DO   REFERRING DIAG: M51.26 (ICD-10-CM) - Lumbar disc herniation  Rationale for Evaluation and Treatment: Rehabilitation  THERAPY DIAG:  Acute left-sided low back pain with left-sided sciatica  Radiculopathy, lumbar region  Muscle weakness (generalized)  Cramp and spasm  Difficulty in walking, not elsewhere classified  ONSET DATE: 10/06/2022  SUBJECTIVE:                                                                                                                                                                                            SUBJECTIVE STATEMENT: Patient had increased spasm when getting up from her bed before her appt. Pain increased from 7 to 8.5/10  PERTINENT HISTORY:  06/27/2019 emergency surgery for large T12-L1 disc herniation on the left side with severe cord compression (THORACIC TWELVE-LUMBAR ONE LAMINECTOMY/DECOMPRESSION MICRODISCECTOMY   T12-L1 left.), diabetes, migraine, obesity, gall bladder removal.   PAIN:  Are you having pain? Yes: NPRS scale: 8.5/10 Pain location: Left side low back shooting down left leg to foot  Pain description: sharp, shooting, stabbing, numbness in L foot Aggravating factors: prolonged standing, walking, bending over, transfers Relieving factors: pain medication  PRECAUTIONS: None  WEIGHT BEARING RESTRICTIONS: No  FALLS:  Has patient fallen in last 6 months?  No falls but left leg feels numb, weak, has had to grab and catch self several times.    LIVING ENVIRONMENT: Lives with: lives with their family Lives in: House/apartment Stairs: Yes: External: 2 steps; on right going up Has following equipment at home: None  OCCUPATION: preschool teacher  PLOF: Independent  PATIENT GOALS: stop the pain, hopefully not need emergency surgery again, get back to work.   NEXT MD VISIT: none scheduled  OBJECTIVE:   DIAGNOSTIC FINDINGS:  MR lumbar spine 10/26/2022 MR LUMBAR SPINE IMPRESSION   1. At L5-S1, moderate left subarticular recess stenosis due to left  paracentral disc protrusion and left-sided synovial cyst. This also  contributes to moderate proximal left foraminal stenosis.  2. At L4-L5, mild left greater than right subarticular recess  narrowing, canal stenosis, and foraminal stenosis.  3. The T12-L1 level is described above.   PATIENT SURVEYS:  Modified Oswestry 29/50 = 58%   SCREENING FOR RED FLAGS: Bowel or  bladder incontinence: No Spinal tumors: No Cauda equina syndrome: No Compression fracture: No Abdominal aneurysm: No  COGNITION: Overall cognitive status: Within functional limits for tasks assessed     SENSATION: Light touch: Impaired  no sensation in LLE except L medial calf.   MUSCLE LENGTH: NT  POSTURE: rounded shoulders and weight shift right  PALPATION: Tenderness throughout left lumbar paraspinals and glutes.   LUMBAR ROM:   AROM eval  Flexion Limited 95%  Extension Limited 95%  Right lateral flexion Limited 75%  Left lateral flexion Limited 100%  Right rotation Limited 75%  Left rotation Limited 100%   (Blank rows = not tested)  LOWER EXTREMITY ROM:   NT due to pain   LOWER EXTREMITY MMT:    MMT Right eval Left* eval  Hip flexion 5 4*  Hip extension    Hip abduction 5 4+  Hip adduction 5 4+   Hip internal rotation    Hip external rotation    Knee flexion 5 3+  Knee extension 5 4+ *  Ankle dorsiflexion 5 3+  Ankle plantarflexion 5 3+  Ankle inversion    Ankle eversion     (Blank rows = not tested) * all movements very slow and painful.   LUMBAR SPECIAL TESTS:  Increased pain/neural tension with left LAQ.  Remaining special tests deferred due to pain levels.   FUNCTIONAL TESTS:  NT due to pain.    GAIT: Distance walked: 29' Assistive device utilized: None Level of assistance: SBA Comments: extremely slow, antalgic gait with weight shift to RLE, step to gait on L.   TODAY'S TREATMENT:  DATE:   11/16/22 ADL/body mechanics: worked on sit <> stand independently and also with assist to minimize trunk flexion. Worked on bed mobility sit<>supine.Transverse abdominus contraction done with transfer. Discussed and PT demonstrated use of squats and or 1/2 kneeling with neutral spine and UE assist for use at work, however pt unable to  attempt today.   Attempted standing weight shift but immediate increase in LLE sx. Seated modified pigeon piriformis stretch L 2x30 sec on mat table.   Prone on elbows x 20 sec increases sx. Prone lying x 2 min increases tingling into LLE and increased pain in low back. Prone lying over pillow decreases sx, however after 5 min in this position during estim, tingling increased again.   Modalities: MHP + Estim (premod) 80-150 Hz x 10 min to L low back and gluteals in prone lying over pillow. Moved to R [facility] for remaining 5 min due to increased tingling in LLE.      11/11/22 Mechanical  Lumbar Traction x 20 min including set-up and steps up, 15 min treatment time at 45 lbs max 30 lbs minimum, 60 sec on/10 sec rest, 2 steps up, 2 steps down, with table at 10 deg decline, to decrease radicular symptoms and pain.  Therapeutic Exercise: to improve strength and mobility.  Demo, verbal and tactile cues throughout for technique.  Trialed gentle exercises focusing on side of preference, unable to perform SKTC due to pain, very small movements with LTR to R ok, seated trunk rotation to R ok, small amplitude, demo only due to pain side glides, standing extension at wall (raise arms rather than try to extend back) and laying prone over 2 pillows.   Manual Therapy: to decrease muscle spasm and pain and improve mobility.  Trialed gentle LLE distraction, discontinued due to poor tolerance.    11/09/2022 Modalities: CP + Estim (premod) applied to L side low back x 5 min however discontinued due to complaint of worsening numbness in LLE.      PATIENT EDUCATION:  Education details: HEP Person educated: Patient Education method: Explanation, Facilities manager, and Handouts Education comprehension: verbalized understanding  HOME EXERCISE PROGRAM: Access Code: [email or web address] URL: [email or web address] Date: 11/11/2022 Prepared by: Harrie Foreman  Exercises - Lateral Shift Correction at Wall  - 3 x  daily - 7 x weekly - 1 sets - 10 reps - Standing Lumbar Extension at Wall  - 3 x daily - 7 x weekly - 1 sets - 10 reps - Lying Prone with 2 Pillows  - 3 x daily - 7 x weekly - 1 sets - 3-5 min  hold  ASSESSMENT:  CLINICAL IMPRESSION: Kallee presents today with reports of increased spasm after getting out of bed. Additionally she reports that on Friday she had increased spasms getting off the commode and generally requires assist. Her description of her transfers indicates that they involve significant flexion. 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 She was able to perform correct sit to stand and  sit <>supine transfers independently today. Another trial of estim to left gluteals and lumbar to help with spasm. Positioning was better in sidelying. Overall Elizabeth is moving better both with walking and bed mobility but she continues to be limited by pain and radicular sx.     OBJECTIVE IMPAIRMENTS: Abnormal gait, decreased activity tolerance, decreased balance, decreased mobility, difficulty walking, decreased ROM, decreased strength, increased muscle spasms, impaired flexibility, impaired sensation, postural dysfunction,  and pain.   ACTIVITY LIMITATIONS: carrying, lifting, bending, sitting, standing, sleeping, stairs, transfers, locomotion level, and caring for others  PARTICIPATION LIMITATIONS: meal prep, cleaning, laundry, driving, shopping, community activity, and occupation  PERSONAL FACTORS: Age, Past/current experiences, and 3+ comorbidities: previous back surgery, chronic back pain, recent gall bladder removal, smoker, obesity  are also affecting patient's functional outcome.   REHAB POTENTIAL: Good  CLINICAL DECISION MAKING: Evolving/moderate complexity  EVALUATION COMPLEXITY: Moderate   GOALS: Goals reviewed with patient? Yes  SHORT TERM GOALS: Target date: 11/30/2022   Patient will be independent  with initial HEP.  Baseline: needs Goal status: IN PROGRESS  2.  Patient will report centralization of radicular symptoms.  Baseline: down left leg to foot Goal status: IN PROGRESS   LONG TERM GOALS: Target date: 12/21/2022    Patient will be independent with advanced/ongoing HEP to improve outcomes and carryover.  Baseline: needs progression Goal status: IN PROGRESS  2.  Patient will report 75% improvement in low back pain to improve QOL.  Baseline:  Goal status: IN PROGRESS  3.  Patient will demonstrate full pain free lumbar ROM to perform ADLs.   Baseline: see objective, severely limited Goal status: IN PROGRESS  4.  Patient will demonstrate improved functional strength as demonstrated by 5/5 LLE strength. Baseline: decreased LLE strength, see objective Goal status: IN PROGRESS  5.  Patient will report < 40% impairment on Modified Oswestry to demonstrate improved functional ability.  Baseline: 58% impairment Goal status: IN PROGRESS   6.  Patient will tolerate 15 min of standing or sitting to perform job activities. Baseline: unable Goal status: IN PROGRESS  7.  Patient will be able to walk 600' with normal gait pattern in > 0.8 m/s for community ambulation.  Baseline: extremely slow and antalgic Goal status: IN PROGRESS   PLAN:  PT FREQUENCY: 2x/week  PT DURATION: 6 weeks  PLANNED INTERVENTIONS: Therapeutic exercises, Therapeutic activity, Neuromuscular re-education, Balance training, Gait training, Patient/Family education, Self Care, Joint mobilization, Stair training, Dry Needling, Electrical stimulation, Spinal mobilization, Cryotherapy, Moist heat, Taping, Traction, Manual therapy, and Re-evaluation.  PLAN FOR NEXT SESSION: mckenzie extension exercises if tolerates (start standing), traction, modalities PRN   Alethea Terhaar, PT 11/16/2022, 2:57 PM

## 2022-11-16 ENCOUNTER — Ambulatory Visit: Payer: Medicaid Other | Admitting: Physical Therapy

## 2022-11-16 ENCOUNTER — Encounter: Payer: Self-pay | Admitting: Physical Therapy

## 2022-11-16 DIAGNOSIS — R262 Difficulty in walking, not elsewhere classified: Secondary | ICD-10-CM

## 2022-11-16 DIAGNOSIS — M5442 Lumbago with sciatica, left side: Secondary | ICD-10-CM | POA: Diagnosis not present

## 2022-11-16 DIAGNOSIS — M6281 Muscle weakness (generalized): Secondary | ICD-10-CM

## 2022-11-16 DIAGNOSIS — M5416 Radiculopathy, lumbar region: Secondary | ICD-10-CM

## 2022-11-16 DIAGNOSIS — R252 Cramp and spasm: Secondary | ICD-10-CM

## 2022-11-24 ENCOUNTER — Ambulatory Visit: Payer: Medicaid Other

## 2022-11-24 DIAGNOSIS — M5416 Radiculopathy, lumbar region: Secondary | ICD-10-CM

## 2022-11-24 DIAGNOSIS — R252 Cramp and spasm: Secondary | ICD-10-CM

## 2022-11-24 DIAGNOSIS — R262 Difficulty in walking, not elsewhere classified: Secondary | ICD-10-CM

## 2022-11-24 DIAGNOSIS — M5442 Lumbago with sciatica, left side: Secondary | ICD-10-CM

## 2022-11-24 DIAGNOSIS — M6281 Muscle weakness (generalized): Secondary | ICD-10-CM

## 2022-11-24 NOTE — Therapy (Signed)
OUTPATIENT PHYSICAL THERAPY TREATMENT   Patient Name: [name] MRN: [id number] DOB:12/01/1990, 32 y.o., female Today's Date: 11/24/2022  END OF SESSION:  PT End of Session - 11/24/22 1151     Visit Number 4    Number of Visits 12    Date for PT Re-Evaluation 12/21/22    Authorization Type [employer]    Authorization Time Period 1/30-3/30    Authorization - Visit Number 4    Authorization - Number of Visits 12    PT Start Time 1106    PT Stop Time 1158    PT Time Calculation (min) 52 min    Activity Tolerance Patient tolerated treatment well    Behavior During Therapy WFL for tasks assessed/performed               Past Medical History:  Diagnosis Date   Diabetes mellitus without complication ([place])    Hypertension    age 65   Migraine    Morbid obesity ([place])    Ovarian cyst    Past Surgical History:  Procedure Laterality Date   CHOLECYSTECTOMY     LUMBAR LAMINECTOMY/DECOMPRESSION MICRODISCECTOMY Left 06/26/2019   Procedure: THORACIC TWELVE-LUMBAR ONE LAMINECTOMY/DECOMPRESSION MICRODISCECTOMY   T12-L1 left;  Surgeon: Earnie Larsson, MD;  Location: [place];  Service: Neurosurgery;  Laterality: Left;   TUBES REMOVED     Patient Active Problem List   Diagnosis Date Noted   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 06/26/2019   SIRS (systemic inflammatory response syndrome) (HCC) 04/06/2015   Hyperglycemia 04/06/2015   Nausea and vomiting 04/06/2015   Migraine 04/06/2015   Essential hypertension 04/06/2015   Syncope 04/06/2015    PCP: Patient, No Pcp Per  REFERRING PROVIDER: Lennice Sites, DO   REFERRING DIAG: M51.26 (ICD-10-CM) - Lumbar disc herniation  Rationale for Evaluation and Treatment: Rehabilitation  THERAPY DIAG:  Acute left-sided low back pain with left-sided sciatica  Radiculopathy, lumbar region  Muscle weakness (generalized)  Cramp and spasm  Difficulty in walking, not elsewhere  classified  ONSET DATE: 10/06/2022  SUBJECTIVE:                                                                                                                                                                                           SUBJECTIVE STATEMENT: "No pain today, it's actually a lot better even after a full day of work."  PERTINENT HISTORY:  06/27/2019 emergency surgery for large T12-L1 disc herniation on the left side with severe cord compression (THORACIC TWELVE-LUMBAR ONE LAMINECTOMY/DECOMPRESSION MICRODISCECTOMY   T12-L1 left.), diabetes, migraine, obesity, gall bladder removal.   PAIN:  Are you having  pain? Yes: NPRS scale: 0-/10 Pain location: Left side low back shooting down left leg to foot Pain description: sharp, shooting, stabbing, numbness in L foot Aggravating factors: prolonged standing, walking, bending over, transfers Relieving factors: pain medication  PRECAUTIONS: None  WEIGHT BEARING RESTRICTIONS: No  FALLS:  Has patient fallen in last 6 months?  No falls but left leg feels numb, weak, has had to grab and catch self several times.    LIVING ENVIRONMENT: Lives with: lives with their family Lives in: House/apartment Stairs: Yes: External: 2 steps; on right going up Has following equipment at home: None  OCCUPATION: preschool teacher  PLOF: Independent  PATIENT GOALS: stop the pain, hopefully not need emergency surgery again, get back to work.   NEXT MD VISIT: none scheduled  OBJECTIVE:   DIAGNOSTIC FINDINGS:  MR lumbar spine 10/26/2022 MR LUMBAR SPINE IMPRESSION   1. At L5-S1, moderate left subarticular recess stenosis due to left  paracentral disc protrusion and left-sided synovial cyst. This also  contributes to moderate proximal left foraminal stenosis.  2. At L4-L5, mild left greater than right subarticular recess  narrowing, canal stenosis, and foraminal stenosis.  3. The T12-L1 level is described above.   PATIENT SURVEYS:  Modified  Oswestry 29/50 = 58%   SCREENING FOR RED FLAGS: Bowel or bladder incontinence: No Spinal tumors: No Cauda equina syndrome: No Compression fracture: No Abdominal aneurysm: No  COGNITION: Overall cognitive status: Within functional limits for tasks assessed     SENSATION: Light touch: Impaired  no sensation in LLE except L medial calf.   MUSCLE LENGTH: NT  POSTURE: rounded shoulders and weight shift right  PALPATION: Tenderness throughout left lumbar paraspinals and glutes.   LUMBAR ROM:   AROM eval  Flexion Limited 95%  Extension Limited 95%  Right lateral flexion Limited 75%  Left lateral flexion Limited 100%  Right rotation Limited 75%  Left rotation Limited 100%   (Blank rows = not tested)  LOWER EXTREMITY ROM:   NT due to pain   LOWER EXTREMITY MMT:    MMT Right eval Left* eval  Hip flexion 5 4*  Hip extension    Hip abduction 5 4+  Hip adduction 5 4+   Hip internal rotation    Hip external rotation    Knee flexion 5 3+  Knee extension 5 4+ *  Ankle dorsiflexion 5 3+  Ankle plantarflexion 5 3+  Ankle inversion    Ankle eversion     (Blank rows = not tested) * all movements very slow and painful.   LUMBAR SPECIAL TESTS:  Increased pain/neural tension with left LAQ.  Remaining special tests deferred due to pain levels.   FUNCTIONAL TESTS:  NT due to pain.    GAIT: Distance walked: 9' Assistive device utilized: None Level of assistance: SBA Comments: extremely slow, antalgic gait with weight shift to RLE, step to gait on L.   TODAY'S TREATMENT:  DATE:  11/24/22 Bike L2x72mn POE x 1 min Prone knee bend x 10  Prone hip extension x 10 bil  Standing hip abduction x 10; progressed another 10 with RTB Standing hip extension x 10; progressed another 10 with RTB Standing HS curls x 10 RTB Standing hip hinge with dowel x  10  11/16/22 ADL/body mechanics: worked on sit <> stand independently and also with assist to minimize trunk flexion. Worked on bed mobility sit<>supine.Transverse abdominus contraction done with transfer. Discussed and PT demonstrated use of squats and or 1/2 kneeling with neutral spine and UE assist for use at work, however pt unable to attempt today.   Attempted standing weight shift but immediate increase in LLE sx. Seated modified pigeon piriformis stretch L 2x30 sec on mat table.   Prone on elbows x 20 sec increases sx. Prone lying x 2 min increases tingling into LLE and increased pain in low back. Prone lying over pillow decreases sx, however after 5 min in this position during estim, tingling increased again.   Modalities: MHP + Estim (premod) 80-150 Hz x 10 min to L low back and gluteals in prone lying over pillow. [facility] due to increased tingling in LLE.      11/11/22 Mechanical  Lumbar Traction x 20 min including set-up and steps up, 15 min treatment time at 45 lbs max 30 lbs minimum, 60 sec on/10 sec rest, 2 steps up, 2 steps down, with table at 10 deg decline, to decrease radicular symptoms and pain.  Therapeutic Exercise: to improve strength and mobility.  Demo, verbal and tactile cues throughout for technique.  Trialed gentle exercises focusing on side of preference, unable to perform SKTC due to pain, very small movements with LTR to R ok, seated trunk rotation to R ok, small amplitude, demo only due to pain side glides, standing extension at wall (raise arms rather than try to extend back) and laying prone over 2 pillows.   Manual Therapy: to decrease muscle spasm and pain and improve mobility.  Trialed gentle LLE distraction, discontinued due to poor tolerance.    11/09/2022 Modalities: CP + Estim (premod) applied to L side low back x 5 min however discontinued due to complaint of worsening numbness in LLE.      PATIENT EDUCATION:  Education details:  HEP Person educated: Patient Education method: Explanation, DMedia planner and Handouts Education comprehension: verbalized understanding  HOME EXERCISE PROGRAM: Access Code: [email or web address]: [email or web address] Date: 11/11/2022 Prepared by: EGlenetta Hew Exercises - [place] - [place] x daily - 7 x weekly - 1 sets - 10 reps - Standing Lumbar Extension at Wall  - 3 x daily - 7 x weekly - 1 sets - 10 reps - Lying Prone with 2 Pillows  - 3 x daily - 7 x weekly - 1 sets - 3-5 min  hold  ASSESSMENT:  CLINICAL IMPRESSION: Pt showed good response to treatment with no increased pain. Able to complete all interventions with no issues. Continued to progress hip strengthening. Pt required cues to keep spine neutral with hip hinges using cane. Progressed RTB for hip ext and abduction. Concluded session with MHP for pain and decrease muscle soreness.    OBJECTIVE IMPAIRMENTS: Abnormal gait, decreased activity tolerance, decreased balance, decreased mobility, difficulty walking, decreased ROM, decreased strength, increased muscle spasms, impaired flexibility, impaired sensation, postural dysfunction, and pain.   ACTIVITY LIMITATIONS: carrying, lifting, bending, sitting, standing, sleeping, stairs, transfers, locomotion level,  and caring for others  PARTICIPATION LIMITATIONS: meal prep, cleaning, laundry, driving, shopping, community activity, and occupation  PERSONAL FACTORS: Age, Past/current experiences, and 3+ comorbidities: previous back surgery, chronic back pain, recent gall bladder removal, smoker, obesity  are also affecting patient's functional outcome.   REHAB POTENTIAL: Good  CLINICAL DECISION MAKING: Evolving/moderate complexity  EVALUATION COMPLEXITY: Moderate   GOALS: Goals reviewed with patient? Yes  SHORT TERM GOALS: Target date: 11/30/2022   Patient will be independent with initial HEP.  Baseline: needs Goal status: IN PROGRESS  2.   Patient will report centralization of radicular symptoms.  Baseline: down left leg to foot Goal status: IN PROGRESS   LONG TERM GOALS: Target date: 12/21/2022    Patient will be independent with advanced/ongoing HEP to improve outcomes and carryover.  Baseline: needs progression Goal status: IN PROGRESS  2.  Patient will report 75% improvement in low back pain to improve QOL.  Baseline:  Goal status: IN PROGRESS  3.  Patient will demonstrate full pain free lumbar ROM to perform ADLs.   Baseline: see objective, severely limited Goal status: IN PROGRESS  4.  Patient will demonstrate improved functional strength as demonstrated by 5/5 LLE strength. Baseline: decreased LLE strength, see objective Goal status: IN PROGRESS  5.  Patient will report < 40% impairment on Modified Oswestry to demonstrate improved functional ability.  Baseline: 58% impairment Goal status: IN PROGRESS   6.  Patient will tolerate 15 min of standing or sitting to perform job activities. Baseline: unable Goal status: IN PROGRESS  7.  Patient will be able to walk 600' with normal gait pattern in > 0.8 m/s for community ambulation.  Baseline: extremely slow and antalgic Goal status: IN PROGRESS   PLAN:  PT FREQUENCY: 2x/week  PT DURATION: 6 weeks  PLANNED INTERVENTIONS: Therapeutic exercises, Therapeutic activity, Neuromuscular re-education, Balance training, Gait training, Patient/Family education, Self Care, Joint mobilization, Stair training, Dry Needling, Electrical stimulation, Spinal mobilization, Cryotherapy, Moist heat, Taping, Traction, Manual therapy, and Re-evaluation.  PLAN FOR NEXT SESSION: mckenzie extension exercises if tolerates (start standing), traction, modalities PRN   Artist Pais, PTA 11/24/2022, 12:09 PM

## 2022-11-29 ENCOUNTER — Encounter: Payer: Self-pay | Admitting: Physical Therapy

## 2022-11-29 ENCOUNTER — Ambulatory Visit: Payer: Medicaid Other | Admitting: Physical Therapy

## 2022-11-29 DIAGNOSIS — R252 Cramp and spasm: Secondary | ICD-10-CM

## 2022-11-29 DIAGNOSIS — M5442 Lumbago with sciatica, left side: Secondary | ICD-10-CM | POA: Diagnosis not present

## 2022-11-29 DIAGNOSIS — R262 Difficulty in walking, not elsewhere classified: Secondary | ICD-10-CM

## 2022-11-29 DIAGNOSIS — M6281 Muscle weakness (generalized): Secondary | ICD-10-CM

## 2022-11-29 DIAGNOSIS — M5416 Radiculopathy, lumbar region: Secondary | ICD-10-CM

## 2022-11-29 NOTE — Therapy (Signed)
OUTPATIENT PHYSICAL THERAPY TREATMENT   Patient Name: [name] MRN: [id number] DOB:04/02/1991, 32 y.o., female Today's Date: 11/29/2022  END OF SESSION:  PT End of Session - 11/29/22 1632     Visit Number 5    Number of Visits 12    Date for PT Re-Evaluation 12/21/22    Authorization Type [employer]    Authorization Time Period 1/30-3/30    Authorization - Visit Number 5    Authorization - Number of Visits 12    PT Start Time 1630    Activity Tolerance Patient tolerated treatment well    Behavior During Therapy [facility] for tasks assessed/performed               Past Medical History:  Diagnosis Date   Diabetes mellitus without complication ([place])    Hypertension    age 32   Migraine    Morbid obesity ([place])    Ovarian cyst    Past Surgical History:  Procedure Laterality Date   CHOLECYSTECTOMY     LUMBAR LAMINECTOMY/DECOMPRESSION MICRODISCECTOMY Left 06/26/2019   Procedure: THORACIC TWELVE-LUMBAR ONE LAMINECTOMY/DECOMPRESSION MICRODISCECTOMY   T12-L1 left;  Surgeon: Earnie Larsson, MD;  Location: [place];  Service: Neurosurgery;  Laterality: Left;   TUBES REMOVED     Patient Active Problem List   Diagnosis Date Noted   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 06/26/2019   SIRS (systemic inflammatory response syndrome) (HCC) 04/06/2015   Hyperglycemia 04/06/2015   Nausea and vomiting 04/06/2015   Migraine 04/06/2015   Essential hypertension 04/06/2015   Syncope 04/06/2015    PCP: Patient, No Pcp Per  REFERRING PROVIDER: Lennice Sites, DO   REFERRING DIAG: M51.26 (ICD-10-CM) - Lumbar disc herniation  Rationale for Evaluation and Treatment: Rehabilitation  THERAPY DIAG:  Acute left-sided low back pain with left-sided sciatica  Radiculopathy, lumbar region  Muscle weakness (generalized)  Cramp and spasm  Difficulty in walking, not elsewhere classified  ONSET DATE: 10/06/2022  SUBJECTIVE:                                                                                                                                                                                            SUBJECTIVE STATEMENT: Doing a lot better, but overdid at work today so some pain.  Don't have numbness in leg all the time now.    PERTINENT HISTORY:  06/27/2019 emergency surgery for large T12-L1 disc herniation on the left side with severe cord compression (THORACIC TWELVE-LUMBAR ONE LAMINECTOMY/DECOMPRESSION MICRODISCECTOMY   T12-L1 left.), diabetes, migraine, obesity, gall bladder removal.   PAIN:  Are you having pain? Yes: NPRS scale: 6/10 Pain location:  Left side low back shooting down left leg to foot Pain description: sharp, shooting, stabbing, numbness in L foot Aggravating factors: prolonged standing, walking, bending over, transfers Relieving factors: pain medication  PRECAUTIONS: None  WEIGHT BEARING RESTRICTIONS: No  FALLS:  Has patient fallen in last 6 months?  No falls but left leg feels numb, weak, has had to grab and catch self several times.    LIVING ENVIRONMENT: Lives with: lives with their family Lives in: House/apartment Stairs: Yes: External: 2 steps; on right going up Has following equipment at home: None  OCCUPATION: preschool teacher  PLOF: Independent  PATIENT GOALS: stop the pain, hopefully not need emergency surgery again, get back to work.   NEXT MD VISIT: none scheduled  OBJECTIVE:   DIAGNOSTIC FINDINGS:  MR lumbar spine 10/26/2022 MR LUMBAR SPINE IMPRESSION   1. At L5-S1, moderate left subarticular recess stenosis due to left  paracentral disc protrusion and left-sided synovial cyst. This also  contributes to moderate proximal left foraminal stenosis.  2. At L4-L5, mild left greater than right subarticular recess  narrowing, canal stenosis, and foraminal stenosis.  3. The T12-L1 level is described above.   PATIENT SURVEYS:  Modified Oswestry 29/50 = 58%   SCREENING FOR RED  FLAGS: Bowel or bladder incontinence: No Spinal tumors: No Cauda equina syndrome: No Compression fracture: No Abdominal aneurysm: No  COGNITION: Overall cognitive status: Within functional limits for tasks assessed     SENSATION: Light touch: Impaired  no sensation in LLE except L medial calf.   MUSCLE LENGTH: NT  POSTURE: rounded shoulders and weight shift right  PALPATION: Tenderness throughout left lumbar paraspinals and glutes.   LUMBAR ROM:   AROM eval  Flexion Limited 95%  Extension Limited 95%  Right lateral flexion Limited 75%  Left lateral flexion Limited 100%  Right rotation Limited 75%  Left rotation Limited 100%   (Blank rows = not tested)  LOWER EXTREMITY ROM:   NT due to pain   LOWER EXTREMITY MMT:    MMT Right eval Left* eval  Hip flexion 5 4*  Hip extension    Hip abduction 5 4+  Hip adduction 5 4+   Hip internal rotation    Hip external rotation    Knee flexion 5 3+  Knee extension 5 4+ *  Ankle dorsiflexion 5 3+  Ankle plantarflexion 5 3+  Ankle inversion    Ankle eversion     (Blank rows = not tested) * all movements very slow and painful.   LUMBAR SPECIAL TESTS:  Increased pain/neural tension with left LAQ.  Remaining special tests deferred due to pain levels.   FUNCTIONAL TESTS:  NT due to pain.    GAIT: Distance walked: 39' Assistive device utilized: None Level of assistance: SBA Comments: extremely slow, antalgic gait with weight shift to RLE, step to gait on L.   TODAY'S TREATMENT:  DATE:   11/29/22 Therapeutic Exercise: to improve strength and mobility.  Demo, verbal and tactile cues throughout for technique. Bike L2 x 6 min Standing side glides x 10 each direction - no pain Standing extension x 10 - no pain Prone laying x 2 min Prone knee bends 2 x 10 bil  Prone press-ups 2 x 10  Prone hip  extensions 2 x 10 bil  Manual Therapy: to decrease muscle spasm and pain and improve mobility IASTM with foam roller to bil glutes, QL, lumbar parspinals, STM/TPR to L erector spinae, L UPA mobs to lumbar spine grade 3-4  11/24/22 Bike L2x59mn POE x 1 min Prone knee bend x 10  Prone hip extension x 10 bil  Standing hip abduction x 10; progressed another 10 with RTB Standing hip extension x 10; progressed another 10 with RTB Standing HS curls x 10 RTB Standing hip hinge with dowel x 10  11/16/22 ADL/body mechanics: worked on sit <> stand independently and also with assist to minimize trunk flexion. Worked on bed mobility sit<>supine.Transverse abdominus contraction done with transfer. Discussed and PT demonstrated use of squats and or 1/2 kneeling with neutral spine and UE assist for use at work, however pt unable to attempt today.   Attempted standing weight shift but immediate increase in LLE sx. Seated modified pigeon piriformis stretch L 2x30 sec on mat table.   Prone on elbows x 20 sec increases sx. Prone lying x 2 min increases tingling into LLE and increased pain in low back. Prone lying over pillow decreases sx, however after 5 min in this position during estim, tingling increased again.   Modalities: MHP + Estim (premod) 80-150 Hz x 10 min to L low back and gluteals in prone lying over pillow. [facility] min due to increased tingling in LLE.     PATIENT EDUCATION:  Education details: continue HEP Person educated: Patient Education method: Explanation, Demonstration, and Handouts Education comprehension: verbalized understanding  HOME EXERCISE PROGRAM: Access Code: [email or web address]: [email or web address] Date: 11/29/2022 Prepared by: EGlenetta Hew Exercises - [place] - 7 x weekly - 1 sets - 10 reps - Standing Lumbar Extension at Wall  - 3 x daily - 7 x weekly - 1 sets - 10 reps - Lying Prone with 2  Pillows  - 3 x daily - 7 x weekly - 1 sets - 3-5 min  hold - Standing Lumbar Extension with Counter  - 3 x daily - 7 x weekly - 1 sets - 10 reps - Beginner Prone Single Leg Raise  - 1 x daily - 7 x weekly - 2 sets - 10 reps - Prone Press Up  - 1 x daily - 7 x weekly - 2 sets - 10 reps  ASSESSMENT:  CLINICAL IMPRESSION: [name] demonstrated significant improvement in symptoms today, reporting decreased radicular symptoms and now has sensation in LLE again.  She reports radicular symptoms primarily with overdoing activity and forward flexion at work.  Today she was able to progress extension based exercises without any increased pain, demonstrating full lumbar extension and good tolerance to side glides, standing extension and prone press-ups.  Noted some neural tension with prone hamstring curls.  Manual therapy at end of session to decrease muscle spasm and pain, declined modalities.  She has met STG #1 and almost met STG #2.  [name] continues to demonstrate potential for improvement and would benefit from continued  skilled therapy to address impairments.       OBJECTIVE IMPAIRMENTS: Abnormal gait, decreased activity tolerance, decreased balance, decreased mobility, difficulty walking, decreased ROM, decreased strength, increased muscle spasms, impaired flexibility, impaired sensation, postural dysfunction, and pain.   ACTIVITY LIMITATIONS: carrying, lifting, bending, sitting, standing, sleeping, stairs, transfers, locomotion level, and caring for others  PARTICIPATION LIMITATIONS: meal prep, cleaning, laundry, driving, shopping, community activity, and occupation  PERSONAL FACTORS: Age, Past/current experiences, and 3+ comorbidities: previous back surgery, chronic back pain, recent gall bladder removal, smoker, obesity  are also affecting patient's functional outcome.   REHAB POTENTIAL: Good  CLINICAL DECISION MAKING: Evolving/moderate complexity  EVALUATION  COMPLEXITY: Moderate   GOALS: Goals reviewed with patient? Yes  SHORT TERM GOALS: Target date: 11/30/2022   Patient will be independent with initial HEP.  Baseline: needs Goal status: MET 11/29/22 -good compliance.  2.  Patient will report centralization of radicular symptoms.  Baseline: down left leg to foot Goal status: IN PROGRESS 11/29/22- improving, intermittent now. Sensation has returned to LLE.    LONG TERM GOALS: Target date: 12/21/2022    Patient will be independent with advanced/ongoing HEP to improve outcomes and carryover.  Baseline: needs progression Goal status: IN PROGRESS  2.  Patient will report 75% improvement in low back pain to improve QOL.  Baseline:  Goal status: IN PROGRESS  3.  Patient will demonstrate full pain free lumbar ROM to perform ADLs.   Baseline: see objective, severely limited Goal status: IN PROGRESS 11/29/22- no pain with extension.   4.  Patient will demonstrate improved functional strength as demonstrated by 5/5 LLE strength. Baseline: decreased LLE strength, see objective Goal status: IN PROGRESS  5.  Patient will report < 40% impairment on Modified Oswestry to demonstrate improved functional ability.  Baseline: 58% impairment Goal status: IN PROGRESS   6.  Patient will tolerate 15 min of standing or sitting to perform job activities. Baseline: unable Goal status: IN PROGRESS  7.  Patient will be able to walk 600' with normal gait pattern in > 0.8 m/s for community ambulation.  Baseline: extremely slow and antalgic Goal status: IN PROGRESS 11/29/22- no longer antalgic, faster gait.    PLAN:  PT FREQUENCY: 2x/week  PT DURATION: 6 weeks  PLANNED INTERVENTIONS: Therapeutic exercises, Therapeutic activity, Neuromuscular re-education, Balance training, Gait training, Patient/Family education, Self Care, Joint mobilization, Stair training, Dry Needling, Electrical stimulation, Spinal mobilization, Cryotherapy, Moist heat, Taping,  Traction, Manual therapy, and Re-evaluation.  PLAN FOR NEXT SESSION: continue to progress McKenzie extension exercises, core strengthening, manual therapy, modalities PRN   Rennie Natter, PT, DPT  11/29/2022, 4:33 PM

## 2022-12-02 ENCOUNTER — Ambulatory Visit: Payer: Medicaid Other

## 2022-12-02 DIAGNOSIS — M6281 Muscle weakness (generalized): Secondary | ICD-10-CM

## 2022-12-02 DIAGNOSIS — R252 Cramp and spasm: Secondary | ICD-10-CM

## 2022-12-02 DIAGNOSIS — R262 Difficulty in walking, not elsewhere classified: Secondary | ICD-10-CM

## 2022-12-02 DIAGNOSIS — M5442 Lumbago with sciatica, left side: Secondary | ICD-10-CM

## 2022-12-02 DIAGNOSIS — M5416 Radiculopathy, lumbar region: Secondary | ICD-10-CM
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
## 2022-12-07 ENCOUNTER — Ambulatory Visit: Payer: Medicaid Other

## 2022-12-07 DIAGNOSIS — M5442 Lumbago with sciatica, left side: Secondary | ICD-10-CM

## 2022-12-07 DIAGNOSIS — M5416 Radiculopathy, lumbar region: Secondary | ICD-10-CM

## 2022-12-07 DIAGNOSIS — R262 Difficulty in walking, not elsewhere classified: Secondary | ICD-10-CM

## 2022-12-07 DIAGNOSIS — M6281 Muscle weakness (generalized): Secondary | ICD-10-CM

## 2022-12-07 DIAGNOSIS — R252 Cramp and spasm: Secondary | ICD-10-CM
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
## 2022-12-13 ENCOUNTER — Ambulatory Visit: Payer: Medicaid Other | Attending: Emergency Medicine | Admitting: Physical Therapy

## 2022-12-13 ENCOUNTER — Encounter: Payer: Self-pay | Admitting: Physical Therapy

## 2022-12-13 DIAGNOSIS — R252 Cramp and spasm: Secondary | ICD-10-CM | POA: Diagnosis present

## 2022-12-13 DIAGNOSIS — M6281 Muscle weakness (generalized): Secondary | ICD-10-CM | POA: Insufficient documentation

## 2022-12-13 DIAGNOSIS — M5442 Lumbago with sciatica, left side: Secondary | ICD-10-CM | POA: Insufficient documentation

## 2022-12-13 DIAGNOSIS — M5416 Radiculopathy, lumbar region: Secondary | ICD-10-CM | POA: Insufficient documentation

## 2022-12-13 DIAGNOSIS — R262 Difficulty in walking, not elsewhere classified: Secondary | ICD-10-CM | POA: Insufficient documentation

## 2022-12-16 ENCOUNTER — Ambulatory Visit: Payer: Medicaid Other | Admitting: Physical Therapy

## 2022-12-16 ENCOUNTER — Encounter: Payer: Self-pay | Admitting: Physical Therapy

## 2022-12-16 DIAGNOSIS — R262 Difficulty in walking, not elsewhere classified: Secondary | ICD-10-CM

## 2022-12-16 DIAGNOSIS — M5442 Lumbago with sciatica, left side: Secondary | ICD-10-CM

## 2022-12-16 DIAGNOSIS — M5416 Radiculopathy, lumbar region: Secondary | ICD-10-CM

## 2022-12-16 DIAGNOSIS — M6281 Muscle weakness (generalized): Secondary | ICD-10-CM

## 2022-12-16 DIAGNOSIS — R252 Cramp and spasm: Secondary | ICD-10-CM
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
## 2022-12-20 ENCOUNTER — Ambulatory Visit: Payer: Medicaid Other | Admitting: Physical Therapy

## 2022-12-20 ENCOUNTER — Encounter: Payer: Self-pay | Admitting: Physical Therapy

## 2022-12-20 DIAGNOSIS — R252 Cramp and spasm: Secondary | ICD-10-CM

## 2022-12-20 DIAGNOSIS — R262 Difficulty in walking, not elsewhere classified: Secondary | ICD-10-CM

## 2022-12-20 DIAGNOSIS — M5442 Lumbago with sciatica, left side: Secondary | ICD-10-CM

## 2022-12-20 DIAGNOSIS — M6281 Muscle weakness (generalized): Secondary | ICD-10-CM

## 2022-12-20 DIAGNOSIS — M5416 Radiculopathy, lumbar region: Secondary | ICD-10-CM

## 2022-12-23 ENCOUNTER — Encounter: Payer: Medicaid Other | Admitting: Physical Therapy

## 2022-12-27 ENCOUNTER — Encounter: Payer: Medicaid Other | Admitting: Physical Therapy

## 2023-04-29 ENCOUNTER — Other Ambulatory Visit: Payer: Self-pay

## 2023-04-29 ENCOUNTER — Other Ambulatory Visit (HOSPITAL_BASED_OUTPATIENT_CLINIC_OR_DEPARTMENT_OTHER): Payer: Self-pay

## 2023-04-29 ENCOUNTER — Emergency Department (HOSPITAL_BASED_OUTPATIENT_CLINIC_OR_DEPARTMENT_OTHER)
Admission: EM | Admit: 2023-04-29 | Discharge: 2023-04-29 | Disposition: A | Discharge status: Home / Self Care | Payer: Medicaid Other | Attending: Emergency Medicine | Admitting: Emergency Medicine

## 2023-04-29 ENCOUNTER — Emergency Department (HOSPITAL_BASED_OUTPATIENT_CLINIC_OR_DEPARTMENT_OTHER): Payer: Medicaid Other

## 2023-04-29 ENCOUNTER — Encounter (HOSPITAL_BASED_OUTPATIENT_CLINIC_OR_DEPARTMENT_OTHER): Payer: Self-pay

## 2023-04-29 DIAGNOSIS — R112 Nausea with vomiting, unspecified: Secondary | ICD-10-CM | POA: Insufficient documentation

## 2023-04-29 DIAGNOSIS — E119 Type 2 diabetes mellitus without complications: Secondary | ICD-10-CM | POA: Insufficient documentation

## 2023-04-29 DIAGNOSIS — I10 Essential (primary) hypertension: Secondary | ICD-10-CM | POA: Diagnosis not present

## 2023-04-29 DIAGNOSIS — Z20822 Contact with and (suspected) exposure to covid-19: Secondary | ICD-10-CM | POA: Diagnosis not present

## 2023-04-29 DIAGNOSIS — R1084 Generalized abdominal pain: Secondary | ICD-10-CM

## 2023-04-29 LAB — URINALYSIS, ROUTINE W REFLEX MICROSCOPIC
Bilirubin Urine: NEGATIVE
Glucose, UA: NEGATIVE mg/dL
Ketones, ur: NEGATIVE mg/dL
Leukocytes,Ua: NEGATIVE
Nitrite: NEGATIVE
Protein, ur: >=300 mg/dL — AB
Specific Gravity, Urine: >=1.03 (ref 1.005–1.030)
pH: 5 (ref 5.0–8.0)

## 2023-04-29 LAB — COMPREHENSIVE METABOLIC PANEL
ALT: 38 U/L (ref 0–44)
AST: 46 U/L — ABNORMAL HIGH (ref 15–41)
Albumin: 3.9 g/dL (ref 3.5–5.0)
Alkaline Phosphatase: 51 U/L (ref 38–126)
Anion gap: 9 (ref 5–15)
BUN: 15 mg/dL (ref 6–20)
CO2: 20 mmol/L — ABNORMAL LOW (ref 22–32)
Calcium: 8.7 mg/dL — ABNORMAL LOW (ref 8.9–10.3)
Chloride: 102 mmol/L (ref 98–111)
Creatinine, Ser: 0.85 mg/dL (ref 0.44–1.00)
GFR, Estimated: >60 mL/min (ref >60)
Glucose, Bld: 199 mg/dL — ABNORMAL HIGH (ref 70–99)
Potassium: 3.9 mmol/L (ref 3.5–5.1)
Sodium: 131 mmol/L — ABNORMAL LOW (ref 135–145)
Total Bilirubin: 1 mg/dL (ref 0.3–1.2)
Total Protein: 7.4 g/dL (ref 6.5–8.1)

## 2023-04-29 LAB — CBC
HCT: 35.3 % — ABNORMAL LOW (ref 36.0–46.0)
Hemoglobin: 12.5 g/dL (ref 12.0–15.0)
MCH: 30 pg (ref 26.0–34.0)
MCHC: 35.4 g/dL (ref 30.0–36.0)
MCV: 84.9 fL (ref 80.0–100.0)
Platelets: 254 10*3/uL (ref 150–400)
RBC: 4.16 MIL/uL (ref 3.87–5.11)
RDW: 12.6 % (ref 11.5–15.5)
WBC: 10.4 10*3/uL (ref 4.0–10.5)
nRBC: 0 % (ref 0.0–0.2)

## 2023-04-29 LAB — URINALYSIS, MICROSCOPIC (REFLEX): RBC / HPF: >50 RBC/hpf (ref 0–5)

## 2023-04-29 LAB — LIPASE, BLOOD: Lipase: 47 U/L (ref 11–51)

## 2023-04-29 LAB — PREGNANCY, URINE: Preg Test, Ur: NEGATIVE

## 2023-04-29 LAB — SARS CORONAVIRUS 2 BY RT PCR: SARS Coronavirus 2 by RT PCR: NEGATIVE

## 2023-04-29 LAB — LACTIC ACID, PLASMA: Lactic Acid, Venous: 1.4 mmol/L (ref 0.5–1.9)

## 2023-04-29 MED ORDER — METOCLOPRAMIDE HCL 10 MG PO TABS
10.0000 mg | ORAL_TABLET | Freq: Four times a day (QID) | ORAL | 0 refills | Status: AC | PRN
  Filled 2023-04-29: qty 20, 5d supply, fill #0

## 2023-04-29 MED ORDER — MORPHINE SULFATE (PF) 4 MG/ML IV SOLN
4.0000 mg | Freq: Once | INTRAVENOUS | Status: AC
  Administered 2023-04-29: 4 mg via INTRAVENOUS

## 2023-04-29 MED ORDER — SULFAMETHOXAZOLE-TRIMETHOPRIM 800-160 MG PO TABS
1.0000 | ORAL_TABLET | Freq: Two times a day (BID) | ORAL | 0 refills | Status: AC
  Filled 2023-04-29: qty 10, 5d supply, fill #0

## 2023-04-29 MED ORDER — IOHEXOL 300 MG/ML  SOLN
100.0000 mL | Freq: Once | INTRAMUSCULAR | Status: AC | PRN
  Administered 2023-04-29: 100 mL via INTRAVENOUS

## 2023-04-29 NOTE — ED Provider Notes (Signed)
[place] Provider Note   CSN: [id number] Arrival date & time: 04/29/23  1003     History  Chief Complaint  Patient presents with   Emesis    [name] is a 32 y.o. female with PMH as listed below who presents with nausea/vomiting over a week, all-over abd pain and chills. Can't take PO. Saw UC last week and thought likely gastroenteritis, also had exposure to GI illness at work. However sxs haven't improved. Saw same UC this AM who sent to ED d/t c/f appendicitis. Has h/o cholecystectomy. Denies urinary sxs, vaginal sxs. Just got off her period. No diarrhea/constipation.    Past Medical History:  Diagnosis Date   Diabetes mellitus without complication (HCC)    Hypertension    age 32   Migraine    Morbid obesity (HCC)    Ovarian cyst        Home Medications Prior to Admission medications   Medication Sig Start Date End Date Taking? Authorizing Provider  cyclobenzaprine (FLEXERIL) 10 MG tablet Take 1 tablet (10 mg total) by mouth 2 (two) times daily as needed for muscle spasms. 11/02/22   Curatolo, Adam, DO  HYDROcodone-acetaminophen (NORCO) 7.5-325 MG tablet Take 1 tablet by mouth every 6 (six) hours as needed for up to 20 doses for Moderate pain (4-6). 05/21/21     HYDROcodone-acetaminophen (NORCO/VICODIN) 5-325 MG tablet Take 1 tablet by mouth every 6 (six) hours as needed for severe pain. 12/01/20   Gailen Shelter, PA  ondansetron (ZOFRAN ODT) 8 MG disintegrating tablet Take 1 tablet (8 mg total) by mouth every 8 (eight) hours as needed. 11/26/20   Zadie Rhine, MD  oxyCODONE (ROXICODONE) 5 MG immediate release tablet Take 1 tablet (5 mg total) by mouth every 6 (six) hours as needed for up to 20 doses. 11/02/22   Curatolo, Adam, DO  Potassium Citrate 15 MEQ (1620 MG) TBCR Take 2 tablets (30 mEq total) by mouth 2 times daily for 360 doses. 05/21/21     tamsulosin (FLOMAX) 0.4 MG CAPS capsule Take 1 capsule (0.4 mg  total) by mouth daily for 14 days. 06/05/21         Allergies    Raspberry    Review of Systems   Review of Systems A 10 point review of systems was performed and is negative unless otherwise reported in HPI.  Physical Exam Updated Vital Signs BP 131/85   Pulse 86   Temp 98.3 F (36.8 C)   Resp 17   Ht 5\' 8"  (1.727 m)   Wt 103 kg   LMP 04/28/2023 (Exact Date)   SpO2 98%   BMI 34.53 kg/m  Physical Exam General: Normal appearing obese female, lying in bed.  HEENT: Sclera anicteric, MMM, trachea midline.  Cardiology: RRR, no murmurs/rubs/gallops. Resp: Normal respiratory rate and effort. CTAB, no wheezes, rhonchi, crackles.  Abd: Diffusely tender, worst in RLQ. Soft, non-distended. No rebound tenderness or guarding.  GU: Deferred. MSK: No peripheral edema or signs of trauma. Extremities without deformity or TTP. No cyanosis or clubbing. Skin: warm, dry. No rashes or lesions. Back: No CVA tenderness Neuro: A&Ox4, CNs II-XII grossly intact. MAEs. Sensation grossly intact.  Psych: Normal mood and affect.   ED Results / Procedures / Treatments   Labs (all labs ordered are listed, but only abnormal results are displayed) Labs Reviewed  COMPREHENSIVE METABOLIC PANEL - Abnormal; Notable for the following components:      Result Value   Sodium  131 (*)    CO2 20 (*)    Glucose, Bld 199 (*)    Calcium 8.7 (*)    AST 46 (*)    All other components within normal limits  CBC - Abnormal; Notable for the following components:   HCT 35.3 (*)    All other components within normal limits  URINALYSIS, ROUTINE W REFLEX MICROSCOPIC - Abnormal; Notable for the following components:   Color, Urine AMBER (*)    Hgb urine dipstick LARGE (*)    Protein, ur >=300 (*)    All other components within normal limits  URINALYSIS, MICROSCOPIC (REFLEX) - Abnormal; Notable for the following components:   Bacteria, UA MANY (*)    All other components within normal limits  SARS CORONAVIRUS 2 BY RT  PCR  LIPASE, BLOOD  PREGNANCY, URINE  LACTIC ACID, PLASMA    EKG None  Radiology No results found.  Procedures Procedures    Medications Ordered in ED Medications  iohexol (OMNIPAQUE) 300 MG/ML solution 100 mL (has no administration in time range)  morphine (PF) 4 MG/ML injection 4 mg (4 mg Intravenous Given 04/29/23 1252)    ED Course/ Medical Decision Making/ A&P                          Medical Decision Making Amount and/or Complexity of Data Reviewed Labs: ordered. Decision-making details documented in ED Course. Radiology: ordered.  Risk Prescription drug management.    This patient presents to the ED for concern of abd pain, N/V, this involves an extensive number of treatment options, and is a complaint that carries with it a high risk of complications and morbidity.  I considered the following differential and admission for this acute, potentially life threatening condition.   MDM:    DDX for this patient's nausea/vomiting/abd pain includes but is not limited to:  Abdominal exam without peritoneal signs. No evidence of acute abdomen at this time. Greatest concern for acute appendicitis. Will get CT. Also consider acute pancreatitis, PUD (including gastric perforation), gastroenteritis, vascular catastrophe, bowel obstruction, viscus perforation, or ovarian torsion, diverticulitis. Pt found to have hematuria on UA, also consider nephrolithiasis/ureterolithiasis. Consider electrolyte derangements, renal injury, and dehydration d/t n/V and poor PO intake. She is very mildly tachycardic on arrival and will resuscitate with fluids. No fever or leukocytosis to indicate SIRS/sepsis.    Clinical Course as of 04/29/23 1457  Fri Apr 29, 2023  1251 Urinalysis, Microscopic (reflex)(!) +hematuria [HN]  1342 Lipase: 47 Neg lipase [HN]  1342 SARS Coronavirus 2 by RT PCR: NEGATIVE Neg covid [HN]  1343 WBC: 10.4 No leukocytosis  [HN]  1343 Lactic Acid, Venous: 1.4 Neg  lactate [HN]  1343 Comprehensive metabolic panel(!) Unremarkable in the context of this patient's presentation. No AG to indicate DKA. [HN]    Clinical Course User Index [HN] Loetta Rough, MD    Labs: I Ordered, and personally interpreted labs.  The pertinent results include:  those listed above  Imaging Studies ordered: I ordered imaging studies including CT abd pelvis I independently visualized and interpreted imaging. I agree with the radiologist interpretation  Additional history obtained from chart review, fiance at bedside.   Cardiac Monitoring: The patient was maintained on a cardiac monitor.  I personally viewed and interpreted the cardiac monitored which showed an underlying rhythm of: NSR  Reevaluation: After the interventions noted above, I reevaluated the patient and found that they have :improved  Social Determinants of Health: Lives  independently  Disposition:  Patient is signed out to the oncoming ED physician Dr. Suezanne Jacquet who is made aware of her history, presentation, exam, workup, and plan. Plan is to obtain CT abd pelvis and reeval patient.    Co morbidities that complicate the patient evaluation  Past Medical History:  Diagnosis Date   Diabetes mellitus without complication (HCC)    Hypertension    age 67   Migraine    Morbid obesity (HCC)    Ovarian cyst      Medicines Meds ordered this encounter  Medications   morphine (PF) 4 MG/ML injection 4 mg   iohexol (OMNIPAQUE) 300 MG/ML solution 100 mL    I have reviewed the patients home medicines and have made adjustments as needed  Problem List / ED Course: Problem List Items Addressed This Visit       Digestive   Nausea and vomiting - Primary   Other Visit Diagnoses     Generalized abdominal pain                       This note was created using dictation software, which may contain spelling or grammatical errors.    Loetta Rough, MD 04/29/23 [phone]

## 2023-04-29 NOTE — ED Triage Notes (Signed)
Patient has had vomiting over a week, abd pain and chills.

## 2023-04-29 NOTE — ED Provider Notes (Signed)
  Physical Exam  BP 127/85   Pulse 88   Temp 98.3 F (36.8 C)   Resp 16   Ht 5\' 8"  (1.727 m)   Wt 103 kg   LMP 04/28/2023 (Exact Date)   SpO2 99%   BMI 34.53 kg/m   Physical Exam Vitals and nursing note reviewed.  Constitutional:      General: She is not in acute distress.    Appearance: She is well-developed.  HENT:     Head: Normocephalic and atraumatic.     Mouth/Throat:     Mouth: Mucous membranes are moist.  Eyes:     Pupils: Pupils are equal, round, and reactive to light.  Cardiovascular:     Rate and Rhythm: Normal rate and regular rhythm.     Heart sounds: No murmur heard. Pulmonary:     Effort: Pulmonary effort is normal. No respiratory distress.     Breath sounds: Normal breath sounds.  Abdominal:     General: Abdomen is flat.     Palpations: Abdomen is soft.     Tenderness: There is no abdominal tenderness.  Musculoskeletal:        General: No tenderness.     Right lower leg: No edema.     Left lower leg: No edema.  Skin:    General: Skin is warm and dry.  Neurological:     General: No focal deficit present.     Mental Status: She is alert. Mental status is at baseline.  Psychiatric:        Mood and Affect: Mood normal.        Behavior: Behavior normal.       ED Course / MDM   Clinical Course as of 04/29/23 1613  Fri Apr 29, 2023  1251 Urinalysis, Microscopic (reflex)(!) +hematuria [HN]  1342 Lipase: 47 Neg lipase [HN]  1342 SARS Coronavirus 2 by RT PCR: NEGATIVE Neg covid [HN]  1343 WBC: 10.4 No leukocytosis  [HN]  1343 Lactic Acid, Venous: 1.4 Neg lactate [HN]  1343 Comprehensive metabolic panel(!) Unremarkable in the context of this patient's presentation. No AG to indicate DKA. [HN]  1509 Received sign out from Dr. Jearld Fenton pending CT scan for abdominal pain, nausea and vomiting. Has some blood in urine, just finishing period. No urinary symptoms.  [WS]  1611 CT scan with no evidence of acute intra-abdominal pathology.  Shows right  renal pelvis stone, may explain some of the patient's hematuria but doubt this would be cause of patient's nausea, vomiting and diarrhea.  No hydronephrosis.  No adnexal masses to suggest pelvic cause of her symptoms.  Extremely low concern for torsion with 1 week of symptoms as well as diarrhea.  Given the length of her symptoms we will give antibiotic therapy and switch antiemetic.  Advise follow-up with outpatient provider.  Recommended follow-up with [facility] if she does not have a primary physician. Will discharge patient to home. All questions answered. Patient comfortable with plan of discharge. Return precautions discussed with patient and specified on the after visit summary.  [WS]    Clinical Course User Index [HN] Loetta Rough, MD [WS] Lonell Grandchild, MD   Medical Decision Making Amount and/or Complexity of Data Reviewed Labs: ordered. Decision-making details documented in ED Course. Radiology: ordered.  Risk Prescription drug management.          Lonell Grandchild, MD 04/29/23 [phone]

## 2023-04-29 NOTE — Discharge Instructions (Signed)
We evaluated you for your nausea and vomiting as well as your abdominal pain.  Your laboratory testing was reassuring and your CT scan did not show any dangerous findings such as appendicitis.  Since you have had symptoms for a week now, I have prescribed you a 5-day course of antibiotics.  Since the nausea medicine you have at home has not helped much, I have prescribed you an alternative nausea medication called Reglan.  Please do not take Reglan and Zofran at the same time.  Please be sure to stay hydrated.  Being able to drink fluids is the most important thing.  You can try to eat bland foods such as crackers, toast, mashed potatoes, or rice prior to eating heavier food.  Keep an eye on your symptoms at home.  If you have any new symptoms such as severe pain, worsening pain, bloody vomit, bloody stools, fainting, lightheadedness or dizziness, high fevers, or any other dangerous symptoms, please return to the emergency department.

## 2024-02-26 ENCOUNTER — Emergency Department (HOSPITAL_BASED_OUTPATIENT_CLINIC_OR_DEPARTMENT_OTHER)

## 2024-02-26 ENCOUNTER — Other Ambulatory Visit: Payer: Self-pay

## 2024-02-26 ENCOUNTER — Emergency Department (HOSPITAL_BASED_OUTPATIENT_CLINIC_OR_DEPARTMENT_OTHER)
Admission: EM | Admit: 2024-02-26 | Discharge: 2024-02-26 | Disposition: A | Discharge status: Home / Self Care | Attending: Emergency Medicine | Admitting: Emergency Medicine

## 2024-02-26 ENCOUNTER — Encounter (HOSPITAL_BASED_OUTPATIENT_CLINIC_OR_DEPARTMENT_OTHER): Payer: Self-pay | Admitting: Emergency Medicine

## 2024-02-26 DIAGNOSIS — R112 Nausea with vomiting, unspecified: Secondary | ICD-10-CM | POA: Insufficient documentation

## 2024-02-26 DIAGNOSIS — R1013 Epigastric pain: Secondary | ICD-10-CM | POA: Diagnosis not present

## 2024-02-26 DIAGNOSIS — R1010 Upper abdominal pain, unspecified: Secondary | ICD-10-CM | POA: Diagnosis present

## 2024-02-26 DIAGNOSIS — R109 Unspecified abdominal pain: Secondary | ICD-10-CM

## 2024-02-26 LAB — COMPREHENSIVE METABOLIC PANEL WITH GFR
ALT: 20 U/L (ref 0–44)
AST: 21 U/L (ref 15–41)
Albumin: 4.2 g/dL (ref 3.5–5.0)
Alkaline Phosphatase: 62 U/L (ref 38–126)
Anion gap: 15 (ref 5–15)
BUN: 16 mg/dL (ref 6–20)
CO2: 21 mmol/L — ABNORMAL LOW (ref 22–32)
Calcium: 9.2 mg/dL (ref 8.9–10.3)
Chloride: 102 mmol/L (ref 98–111)
Creatinine, Ser: 1.09 mg/dL — ABNORMAL HIGH (ref 0.44–1.00)
GFR, Estimated: >60 mL/min (ref >60)
Glucose, Bld: 167 mg/dL — ABNORMAL HIGH (ref 70–99)
Potassium: 4.2 mmol/L (ref 3.5–5.1)
Sodium: 138 mmol/L (ref 135–145)
Total Bilirubin: 0.3 mg/dL (ref 0.0–1.2)
Total Protein: 7.2 g/dL (ref 6.5–8.1)

## 2024-02-26 LAB — URINALYSIS, ROUTINE W REFLEX MICROSCOPIC
Bilirubin Urine: NEGATIVE
Glucose, UA: NEGATIVE mg/dL
Ketones, ur: NEGATIVE mg/dL
Leukocytes,Ua: NEGATIVE
Nitrite: NEGATIVE
Protein, ur: 30 mg/dL — AB
Specific Gravity, Urine: 1.015 (ref 1.005–1.030)
pH: 5 (ref 5.0–8.0)

## 2024-02-26 LAB — CBC WITH DIFFERENTIAL/PLATELET
Abs Immature Granulocytes: 0.03 10*3/uL (ref 0.00–0.07)
Basophils Absolute: 0 10*3/uL (ref 0.0–0.1)
Basophils Relative: 0 %
Eosinophils Absolute: 0.2 10*3/uL (ref 0.0–0.5)
Eosinophils Relative: 2 %
HCT: 34.1 % — ABNORMAL LOW (ref 36.0–46.0)
Hemoglobin: 11.8 g/dL — ABNORMAL LOW (ref 12.0–15.0)
Immature Granulocytes: 0 %
Lymphocytes Relative: 30 %
Lymphs Abs: 3.3 10*3/uL (ref 0.7–4.0)
MCH: 30 pg (ref 26.0–34.0)
MCHC: 34.6 g/dL (ref 30.0–36.0)
MCV: 86.8 fL (ref 80.0–100.0)
Monocytes Absolute: 0.7 10*3/uL (ref 0.1–1.0)
Monocytes Relative: 6 %
Neutro Abs: 6.8 10*3/uL (ref 1.7–7.7)
Neutrophils Relative %: 62 %
Platelets: 292 10*3/uL (ref 150–400)
RBC: 3.93 MIL/uL (ref 3.87–5.11)
RDW: 12.6 % (ref 11.5–15.5)
WBC: 11.1 10*3/uL — ABNORMAL HIGH (ref 4.0–10.5)
nRBC: 0 % (ref 0.0–0.2)

## 2024-02-26 LAB — LIPASE, BLOOD: Lipase: 120 U/L — ABNORMAL HIGH (ref 11–51)

## 2024-02-26 LAB — URINALYSIS, MICROSCOPIC (REFLEX)

## 2024-02-26 LAB — HCG, SERUM, QUALITATIVE: Preg, Serum: NEGATIVE

## 2024-02-26 MED ORDER — METOCLOPRAMIDE HCL 5 MG/ML IJ SOLN
10.0000 mg | Freq: Once | INTRAMUSCULAR | Status: AC
Start: 2024-02-26 — End: 2024-02-26
  Administered 2024-02-26: 10 mg via INTRAVENOUS
  Filled 2024-02-26: qty 2

## 2024-02-26 MED ORDER — HYDROMORPHONE HCL 1 MG/ML IJ SOLN
1.0000 mg | Freq: Once | INTRAMUSCULAR | Status: AC
  Administered 2024-02-26: 1 mg via INTRAVENOUS
  Filled 2024-02-26: qty 1

## 2024-02-26 MED ORDER — ONDANSETRON HCL 4 MG PO TABS: 4.0000 mg | ORAL_TABLET | Freq: Four times a day (QID) | ORAL | 0 refills | Status: DC | PRN

## 2024-02-26 MED ORDER — PANTOPRAZOLE SODIUM 40 MG PO TBEC: 40.0000 mg | DELAYED_RELEASE_TABLET | Freq: Every day | ORAL | 0 refills | Status: DC

## 2024-02-26 MED ORDER — ONDANSETRON HCL 4 MG/2ML IJ SOLN
4.0000 mg | Freq: Once | INTRAMUSCULAR | Status: AC
  Administered 2024-02-26: 4 mg via INTRAVENOUS
  Filled 2024-02-26: qty 2

## 2024-02-26 MED ORDER — HYOSCYAMINE SULFATE 0.125 MG SL SUBL: 0.1250 mg | SUBLINGUAL_TABLET | SUBLINGUAL | 0 refills | Status: DC | PRN

## 2024-02-26 MED ORDER — SODIUM CHLORIDE 0.9 % IV BOLUS
1000.0000 mL | Freq: Once | INTRAVENOUS | Status: AC
  Administered 2024-02-26: 1000 mL via INTRAVENOUS

## 2024-02-26 MED ORDER — IOHEXOL 300 MG/ML  SOLN
100.0000 mL | Freq: Once | INTRAMUSCULAR | Status: AC | PRN
  Administered 2024-02-26: 100 mL via INTRAVENOUS

## 2024-02-26 MED ORDER — HALOPERIDOL LACTATE 5 MG/ML IJ SOLN
2.0000 mg | Freq: Once | INTRAMUSCULAR | Status: AC
Start: 2024-02-26 — End: 2024-02-26
  Administered 2024-02-26: 2 mg via INTRAVENOUS
  Filled 2024-02-26: qty 1

## 2024-02-26 MED ORDER — SUCRALFATE 1 G PO TABS: 1.0000 g | ORAL_TABLET | Freq: Three times a day (TID) | ORAL | 0 refills | Status: DC

## 2024-02-26 NOTE — ED Provider Notes (Signed)
 [place] [facility] AT [facility] Provider Note   CSN: [id number] Arrival date & time: 02/26/24  0116     History  Chief Complaint  Patient presents with   Abdominal Pain    [name] is a 33 y.o. female.  Patient presents to the emergency department for evaluation of abdominal pain.  Patient experiencing diffuse upper abdominal pain with nausea and vomiting.  She has not had any fever, diarrhea.       Home Medications Prior to Admission medications   Medication Sig Start Date End Date Taking? Authorizing Provider  hyoscyamine (LEVSIN/SL) 0.125 MG SL tablet Place 1 tablet (0.125 mg total) under the tongue every 4 (four) hours as needed. 02/26/24  Yes Katreena Schupp, Marine Sia, MD  ondansetron  (ZOFRAN ) 4 MG tablet Take 1 tablet (4 mg total) by mouth every 6 (six) hours as needed for nausea or vomiting. 02/26/24  Yes Mandy Peeks, Marine Sia, MD  pantoprazole (PROTONIX) 40 MG tablet Take 1 tablet (40 mg total) by mouth daily. 02/26/24  Yes Tiauna Whisnant, Marine Sia, MD  sucralfate (CARAFATE) 1 g tablet Take 1 tablet (1 g total) by mouth 4 (four) times daily -  with meals and at bedtime. 02/26/24  Yes Myan Suit, Marine Sia, MD  cyclobenzaprine  (FLEXERIL ) 10 MG tablet Take 1 tablet (10 mg total) by mouth 2 (two) times daily as needed for muscle spasms. 11/02/22   Curatolo, Adam, DO  HYDROcodone -acetaminophen  (NORCO) 7.5-325 MG tablet Take 1 tablet by mouth every 6 (six) hours as needed for up to 20 doses for Moderate pain (4-6). 05/21/21     HYDROcodone -acetaminophen  (NORCO/VICODIN) 5-325 MG tablet Take 1 tablet by mouth every 6 (six) hours as needed for severe pain. 12/01/20   Coretta Dexter, PA  metoCLOPramide  (REGLAN ) 10 MG tablet Take 1 tablet (10 mg total) by mouth every 6 (six) hours as needed for nausea. 04/29/23   Mordecai Applebaum, MD  ondansetron  (ZOFRAN  ODT) 8 MG disintegrating tablet Take 1 tablet (8 mg total) by mouth every 8 (eight) hours as needed.  11/26/20   Eldon Greenland, MD  oxyCODONE  (ROXICODONE ) 5 MG immediate release tablet Take 1 tablet (5 mg total) by mouth every 6 (six) hours as needed for up to 20 doses. 11/02/22   Curatolo, Adam, DO  Potassium Citrate  15 MEQ (1620 MG) TBCR Take 2 tablets (30 mEq total) by mouth 2 times daily for 360 doses. 05/21/21     tamsulosin  (FLOMAX ) 0.4 MG CAPS capsule Take 1 capsule (0.4 mg total) by mouth daily for 14 days. 06/05/21         Allergies    Raspberry    Review of Systems   Review of Systems  Physical Exam Updated Vital Signs BP 113/64   Pulse 86   Temp 98.2 F (36.8 C) (Oral)   Resp 12   LMP 02/19/2024   SpO2 94%  Physical Exam Vitals and nursing note reviewed.  Constitutional:      General: She is not in acute distress.    Appearance: She is well-developed.  HENT:     Head: Normocephalic and atraumatic.     Mouth/Throat:     Mouth: Mucous membranes are moist.  Eyes:     General: Vision grossly intact. Gaze aligned appropriately.     Extraocular Movements: Extraocular movements intact.     Conjunctiva/sclera: Conjunctivae normal.  Cardiovascular:     Rate and Rhythm: Normal rate and regular rhythm.     Pulses: Normal pulses.  Heart sounds: Normal heart sounds, S1 normal and S2 normal. No murmur heard.    No friction rub. No gallop.  Pulmonary:     Effort: Pulmonary effort is normal. No respiratory distress.     Breath sounds: Normal breath sounds.  Abdominal:     General: Bowel sounds are normal.     Palpations: Abdomen is soft.     Tenderness: There is abdominal tenderness in the epigastric area. There is no guarding or rebound.     Hernia: No hernia is present.  Musculoskeletal:        General: No swelling.     Cervical back: Full passive range of motion without pain, normal range of motion and neck supple. No spinous process tenderness or muscular tenderness. Normal range of motion.     Right lower leg: No edema.     Left lower leg: No edema.  Skin:     General: Skin is warm and dry.     Capillary Refill: Capillary refill takes less than 2 seconds.     Findings: No ecchymosis, erythema, rash or wound.  Neurological:     General: No focal deficit present.     Mental Status: She is alert and oriented to person, place, and time.     GCS: GCS eye subscore is 4. GCS verbal subscore is 5. GCS motor subscore is 6.     Cranial Nerves: Cranial nerves 2-12 are intact.     Sensory: Sensation is intact.     Motor: Motor function is intact.     Coordination: Coordination is intact.  Psychiatric:        Attention and Perception: Attention normal.        Mood and Affect: Mood normal.        Speech: Speech normal.        Behavior: Behavior normal.     ED Results / Procedures / Treatments   Labs (all labs ordered are listed, but only abnormal results are displayed) Labs Reviewed  CBC WITH DIFFERENTIAL/PLATELET - Abnormal; Notable for the following components:      Result Value   WBC 11.1 (*)    Hemoglobin 11.8 (*)    HCT 34.1 (*)    All other components within normal limits  COMPREHENSIVE METABOLIC PANEL WITH GFR - Abnormal; Notable for the following components:   CO2 21 (*)    Glucose, Bld 167 (*)    Creatinine, Ser 1.09 (*)    All other components within normal limits  LIPASE, BLOOD - Abnormal; Notable for the following components:   Lipase 120 (*)    All other components within normal limits  HCG, SERUM, QUALITATIVE  CBC WITH DIFFERENTIAL/PLATELET  URINALYSIS, ROUTINE W REFLEX MICROSCOPIC    EKG EKG Interpretation Date/Time:  Sunday Feb 26 2024 01:39:59 EDT Ventricular Rate:  101 PR Interval:  163 QRS Duration:  106 QT Interval:  356 QTC Calculation: 462 R Axis:   -39  Text Interpretation: Sinus tachycardia Left axis deviation Confirmed by Ballard Bongo [phone]) on 02/26/2024 2:06:02 AM  Radiology CT ABDOMEN PELVIS W CONTRAST Result Date: 02/26/2024 CLINICAL DATA:  Acute abdominal pain EXAM: CT ABDOMEN AND PELVIS  WITH CONTRAST TECHNIQUE: Multidetector CT imaging of the abdomen and pelvis was performed using the standard protocol following bolus administration of intravenous contrast. RADIATION DOSE REDUCTION: This exam was performed according to the departmental dose-optimization program which includes automated exposure control, adjustment of the mA and/or kV according to patient size and/or use of iterative reconstruction  technique. CONTRAST:  OMNIPAQUE  IOHEXOL  300 MG/ML  SOLN COMPARISON:  04/29/2023 FINDINGS: Lower chest: No acute abnormality. Hepatobiliary: No focal liver abnormality is seen. Status post cholecystectomy. No biliary dilatation. Pancreas: Unremarkable. No pancreatic ductal dilatation or surrounding inflammatory changes. Spleen: Normal in size without focal abnormality. Adrenals/Urinary Tract: Adrenal glands are within normal limits. Kidneys demonstrate a normal enhancement pattern bilaterally. Right renal pelvic stone is noted which measures up to 15 mm in greatest dimension. No obstructive changes are seen. The bladder is decompressed. Stomach/Bowel: The appendix is within normal limits. No obstructive or inflammatory changes of the colon are seen. Small bowel and stomach are within normal limits. Vascular/Lymphatic: No significant vascular findings are present. No enlarged abdominal or pelvic lymph nodes. Reproductive: Uterus is within normal limits. Ovaries appear within normal limits bilaterally stable from the prior exam. Other: No abdominal wall hernia or abnormality. No abdominopelvic ascites. Musculoskeletal: No acute or significant osseous findings. IMPRESSION: 15 mm right renal pelvic stone without obstructive change. No other focal abnormality is noted. Electronically Signed   By: Violeta Grey M.D.   On: 02/26/2024 03:23    Procedures Procedures    Medications Ordered in ED Medications  sodium chloride  0.9 % bolus 1,000 mL (0 mLs Intravenous Stopped 02/26/24 0233)  ondansetron   (ZOFRAN ) injection 4 mg (4 mg Intravenous Given 02/26/24 0136)  HYDROmorphone  (DILAUDID ) injection 1 mg (1 mg Intravenous Given 02/26/24 0136)  metoCLOPramide  (REGLAN ) injection 10 mg (10 mg Intravenous Given 02/26/24 0136)  iohexol  (OMNIPAQUE ) 300 MG/ML solution 100 mL (100 mLs Intravenous Contrast Given 02/26/24 0310)    ED Course/ Medical Decision Making/ A&P                                 Medical Decision Making Amount and/or Complexity of Data Reviewed Labs: ordered. Radiology: ordered.  Risk Prescription drug management.   Differential Diagnosis considered includes, but not limited to: Cholelithiasis; cholecystitis; cholangitis; bowel obstruction; esophagitis; gastritis; peptic ulcer disease; pancreatitis; cardiac.  Patient with upper abdominal pain associated with nausea and vomiting.  Abdominal exam reveals central upper tenderness without guarding, rebound.  No tenderness at McBurney's point.  Negative Murphy sign.  No signs of peritonitis.  White blood cell count 11.1.  Patient with mild elevation of lipase at 120.  Remainder of lab work is unremarkable.  Patient underwent CT scan to further evaluate.  No acute abnormality is noted including no inflammatory changes around the pancreas.  Normal appendix.  She has had prior cholecystectomy and there is no biliary dilatation.  Symptomatically with analgesia, emetics.  Treat for possible gastritis.  Patient does not require hospitalization.        Final Clinical Impression(s) / ED Diagnoses Final diagnoses:  Abdominal pain, unspecified abdominal location    Rx / DC Orders ED Discharge Orders          Ordered    hyoscyamine (LEVSIN/SL) 0.125 MG SL tablet  Every 4 hours PRN        02/26/24 0353    pantoprazole (PROTONIX) 40 MG tablet  Daily        02/26/24 0353    sucralfate (CARAFATE) 1 g tablet  3 times daily with meals & bedtime        02/26/24 0353    ondansetron  (ZOFRAN ) 4 MG tablet  Every 6 hours PRN         02/26/24 0353  Ballard Bongo, MD 02/26/24 [phone]

## 2024-02-26 NOTE — ED Triage Notes (Signed)
 Abdominal pain with N/V. Denies diarrhea. Started a couple hours ago after dinner.

## 2024-02-26 NOTE — ED Notes (Signed)
 ED Provider at bedside.

## 2024-03-23 ENCOUNTER — Encounter (HOSPITAL_BASED_OUTPATIENT_CLINIC_OR_DEPARTMENT_OTHER): Payer: Self-pay

## 2024-03-23 ENCOUNTER — Emergency Department (HOSPITAL_BASED_OUTPATIENT_CLINIC_OR_DEPARTMENT_OTHER)
Admission: EM | Admit: 2024-03-23 | Discharge: 2024-03-23 | Disposition: A | Discharge status: Home / Self Care | Attending: Emergency Medicine | Admitting: Emergency Medicine

## 2024-03-23 ENCOUNTER — Other Ambulatory Visit: Payer: Self-pay

## 2024-03-23 DIAGNOSIS — R519 Headache, unspecified: Secondary | ICD-10-CM | POA: Insufficient documentation

## 2024-03-23 MED ORDER — DIPHENHYDRAMINE HCL 50 MG/ML IJ SOLN
25.0000 mg | Freq: Once | INTRAMUSCULAR | Status: DC
  Filled 2024-03-23: qty 1

## 2024-03-23 MED ORDER — PROCHLORPERAZINE EDISYLATE 10 MG/2ML IJ SOLN: 10.0000 mg | Freq: Once | INTRAMUSCULAR | Status: AC

## 2024-03-23 MED ORDER — KETOROLAC TROMETHAMINE 15 MG/ML IJ SOLN: 15.0000 mg | Freq: Once | INTRAMUSCULAR | Status: AC

## 2024-03-23 MED ORDER — PROCHLORPERAZINE EDISYLATE 10 MG/2ML IJ SOLN
10.0000 mg | Freq: Once | INTRAMUSCULAR | Status: DC
  Filled 2024-03-23: qty 2

## 2024-03-23 MED ORDER — KETOROLAC TROMETHAMINE 15 MG/ML IJ SOLN
15.0000 mg | Freq: Once | INTRAMUSCULAR | Status: DC
  Filled 2024-03-23: qty 1

## 2024-03-23 MED ORDER — DIPHENHYDRAMINE HCL 50 MG/ML IJ SOLN
25.0000 mg | Freq: Once | INTRAMUSCULAR | Status: AC
  Administered 2024-03-23: 25 mg via INTRAVENOUS

## 2024-03-23 NOTE — ED Provider Notes (Signed)
[name] Provider Note   CSN: [id number] Arrival date & time: 03/23/24  1006     Patient presents with: Headache   [name] is a 33 y.o. female.   33 yo F with a chief complaints of a headache.  Going on for about 3 days now.  Has a history of headaches and thinks this feels similar.  Denies any injury to the head or the neck.  Denies cough congestion or fever.  Has had this happen to her in the past and improved with a headache cocktail.   Headache      Prior to Admission medications   Medication Sig Start Date End Date Taking? Authorizing Provider  cyclobenzaprine  (FLEXERIL ) 10 MG tablet Take 1 tablet (10 mg total) by mouth 2 (two) times daily as needed for muscle spasms. 11/02/22   Curatolo, Adam, DO  HYDROcodone -acetaminophen  (NORCO) 7.5-325 MG tablet Take 1 tablet by mouth every 6 (six) hours as needed for up to 20 doses for Moderate pain (4-6). 05/21/21     HYDROcodone -acetaminophen  (NORCO/VICODIN) 5-325 MG tablet Take 1 tablet by mouth every 6 (six) hours as needed for severe pain. 12/01/20   Coretta Dexter, PA  hyoscyamine  (LEVSIN [place]) 0.125 MG SL tablet Place 1 tablet (0.125 mg total) under the tongue every 4 (four) hours as needed. 02/26/24   Ballard Bongo, MD  metoCLOPramide  (REGLAN ) 10 MG tablet Take 1 tablet (10 mg total) by mouth every 6 (six) hours as needed for nausea. 04/29/23   Mordecai Applebaum, MD  ondansetron  (ZOFRAN  ODT) 8 MG disintegrating tablet Take 1 tablet (8 mg total) by mouth every 8 (eight) hours as needed. 11/26/20   Eldon Greenland, MD  ondansetron  (ZOFRAN ) 4 MG tablet Take 1 tablet (4 mg total) by mouth every 6 (six) hours as needed for nausea or vomiting. 02/26/24   Pollina, Marine Sia, MD  oxyCODONE  (ROXICODONE ) 5 MG immediate release tablet Take 1 tablet (5 mg total) by mouth every 6 (six) hours as needed for up to 20 doses. 11/02/22   Curatolo, Adam, DO  pantoprazole  (PROTONIX )  40 MG tablet Take 1 tablet (40 mg total) by mouth daily. 02/26/24   Ballard Bongo, MD  Potassium Citrate  15 MEQ (1620 MG) TBCR Take 2 tablets (30 mEq total) by mouth 2 times daily for 360 doses. 05/21/21     sucralfate  (CARAFATE ) 1 g tablet Take 1 tablet (1 g total) by mouth 4 (four) times daily -  with meals and at bedtime. 02/26/24   Ballard Bongo, MD  tamsulosin  (FLOMAX ) 0.4 MG CAPS capsule Take 1 capsule (0.4 mg total) by mouth daily for 14 days. 06/05/21       Allergies: Raspberry    Review of Systems  Neurological:  Positive for headaches.    Updated Vital Signs BP (!) 162/97   Pulse 78   Temp 97.7 F (36.5 C) (Oral)   Resp 17   Ht 5' 8 (1.727 m)   Wt 104.3 kg   LMP 03/23/2024   SpO2 100%   BMI 34.97 kg/m   Physical Exam Vitals and nursing note reviewed.  Constitutional:      General: She is not in acute distress.    Appearance: She is well-developed. She is not diaphoretic.  HENT:     Head: Normocephalic and atraumatic.   Eyes:     Pupils: Pupils are equal, round, and reactive to light.    Cardiovascular:     Rate  and Rhythm: Normal rate and regular rhythm.     Heart sounds: No murmur heard.    No friction rub. No gallop.  Pulmonary:     Effort: Pulmonary effort is normal.     Breath sounds: No wheezing or rales.  Abdominal:     General: There is no distension.     Palpations: Abdomen is soft.     Tenderness: There is no abdominal tenderness.   Musculoskeletal:        General: No tenderness.     Cervical back: Normal range of motion and neck supple.   Skin:    General: Skin is warm and dry.   Neurological:     Mental Status: She is alert and oriented to person, place, and time.     Comments: Photophobia limits neurologic exam.  No obvious weakness.  Psychiatric:        Behavior: Behavior normal.     (all labs ordered are listed, but only abnormal results are displayed) Labs Reviewed - No data to  display  EKG: None  Radiology: No results found.   Procedures   Medications Ordered in the ED  ketorolac  (TORADOL ) 15 MG/ML injection 15 mg (15 mg Intravenous Given 03/23/24 1105)  prochlorperazine (COMPAZINE) injection 10 mg (10 mg Intravenous Given 03/23/24 1105)  diphenhydrAMINE  (BENADRYL ) injection 25 mg (25 mg Intravenous Given 03/23/24 1104)                                    Medical Decision Making Risk Prescription drug management.   33yo F with a chief complaints of headache.  Has a history of headaches like this in the past.  Has had to come to the emergency department previously for headache cocktail.  Will give a headache cocktail here.  Reassess.  Patient was feeling much better here and like to go home.  Will have her follow-up with her doctor in the office.  Given referral to neurology.  2:19 PM:  I have discussed the diagnosis/risks/treatment options with the patient.  Evaluation and diagnostic testing in the emergency department does not suggest an emergent condition requiring admission or immediate intervention beyond what has been performed at this time.  They will follow up with PCP. We also discussed returning to the ED immediately if new or worsening sx occur. We discussed the sx which are most concerning (e.g., sudden worsening pain, fever, inability to tolerate by mouth) that necessitate immediate return. Medications administered to the patient during their visit and any new prescriptions provided to the patient are listed below.  Medications given during this visit Medications  ketorolac  (TORADOL ) 15 MG/ML injection 15 mg (15 mg Intravenous Given 03/23/24 1105)  prochlorperazine (COMPAZINE) injection 10 mg (10 mg Intravenous Given 03/23/24 1105)  diphenhydrAMINE  (BENADRYL ) injection 25 mg (25 mg Intravenous Given 03/23/24 1104)     The patient appears reasonably screen and/or stabilized for discharge and I doubt any other medical condition or other [facility]  requiring further screening, evaluation, or treatment in the ED at this time prior to discharge.       Final diagnoses:  Bad headache    ED Discharge Orders          Ordered    Ambulatory referral to Neurology       Comments: Headache syndrome?   03/23/24 1218               Albertus Hughs, DO 03/23/24  1419  

## 2024-03-23 NOTE — Discharge Instructions (Signed)
 I am glad you are feeling better.  Please follow-up with your family doctor in the office.  As we discussed I placed a referral for the neurologist to call you to try and set up an appointment.

## 2024-03-23 NOTE — ED Notes (Signed)
Pt ambulated to bathroom with no issue. 

## 2024-03-23 NOTE — ED Triage Notes (Signed)
 Pt reports that she has had a migraine for 3 days now. Reports hx of them. Denies nausea and vomiting. Reports light sensitivity.

## 2024-05-17 ENCOUNTER — Ambulatory Visit: Admitting: Neurology

## 2024-05-17 ENCOUNTER — Encounter: Payer: Self-pay | Admitting: Neurology

## 2024-05-17 VITALS — BP 152/93 | HR 79 | Ht 68.0 in | Wt 245.0 lb

## 2024-05-17 DIAGNOSIS — G43709 Chronic migraine without aura, not intractable, without status migrainosus: Secondary | ICD-10-CM | POA: Diagnosis not present

## 2024-05-17 DIAGNOSIS — H539 Unspecified visual disturbance: Secondary | ICD-10-CM

## 2024-05-17 DIAGNOSIS — R51 Headache with orthostatic component, not elsewhere classified: Secondary | ICD-10-CM | POA: Diagnosis not present

## 2024-05-17 DIAGNOSIS — R5383 Other fatigue: Secondary | ICD-10-CM

## 2024-05-17 DIAGNOSIS — R519 Headache, unspecified: Secondary | ICD-10-CM

## 2024-05-17 MED ORDER — SUMATRIPTAN SUCCINATE 100 MG PO TABS: 100.0000 mg | ORAL_TABLET | Freq: Once | ORAL | 12 refills | Status: DC | PRN

## 2024-05-17 MED ORDER — TOPIRAMATE 50 MG PO TABS: ORAL_TABLET | ORAL | 11 refills | Status: AC

## 2024-05-17 MED ORDER — ONDANSETRON 8 MG PO TBDP: 8.0000 mg | ORAL_TABLET | Freq: Three times a day (TID) | ORAL | 11 refills | Status: AC | PRN
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
## 2024-05-17 NOTE — Patient Instructions (Addendum)
 Start Topiramate  50mg (1 tab) and then in 2 weeks if no side effects increase to 100mg (2 tabs) At onset migraine, take Sumatriptan  and can repeat it in 2 hours if needed. Ondansetron  for nausea (can take it with the sumatriptan )  MRI brain w/wo contrast Blood work  Sumatriptan  Tablets What is this medication? SUMATRIPTAN  (soo ma TRIP tan) treats migraines. It works by blocking pain signals and narrowing blood vessels in the brain. It belongs to a group of medications called triptans. It is not used to prevent migraines. This medicine may be used for other purposes; ask your health care provider or pharmacist if you have questions. COMMON BRAND NAME(S): Imitrex , Migraine Pack What should I tell my care team before I take this medication? They need to know if you have any of these conditions: Circulation problems in fingers and toes Diabetes Heart disease High blood pressure High cholesterol History of irregular heartbeat History of stroke Kidney disease Liver disease Stomach or intestine problems Tobacco use An unusual or allergic reaction to sumatriptan , other medications, foods, dyes, or preservatives Pregnant or trying to get pregnant Breastfeeding How should I use this medication? Take this medication by mouth with a glass of water. Follow the directions on the prescription label. Do not take it more often than directed. Talk to your care team about the use of this medication in children. Special care may be needed. Overdosage: If you think you have taken too much of this medicine contact a poison control center or emergency room at once. NOTE: This medicine is only for you. Do not share this medicine with others. What if I miss a dose? This does not apply. This medication is not for regular use. What may interact with this medication? Do not take this medication with any of the following: Certain medications for migraine headache, such as almotriptan, eletriptan, frovatriptan,  naratriptan, rizatriptan, sumatriptan , zolmitriptan Ergot alkaloids, such as dihydroergotamine, ergonovine, ergotamine, methylergonovine MAOIs, such as Carbex, Eldepryl, Marplan, Nardil, and Parnate This medication may also interact with the following: Certain medications for mental health conditions This list may not describe all possible interactions. Give your health care provider a list of all the medicines, herbs, non-prescription drugs, or dietary supplements you use. Also tell them if you smoke, drink alcohol, or use illegal drugs. Some items may interact with your medicine. What should I watch for while using this medication? Visit your care team for regular checks on your progress. Tell your care team if your symptoms do not start to get better or if they get worse. This medication may affect your coordination, reaction time, or judgment. Do not drive or operate machinery until you know how this medication affects you. Sit up or stand slowly to reduce the risk of dizzy or fainting spells. Drinking alcohol with this medication can increase the risk of these side effects. Tell your care team right away if you have any change in your eyesight. If you take migraine medications for 10 or more days a month, your migraines may get worse. Keep a diary of headache days and medication use. Contact your care team if your migraine attacks occur more frequently. What side effects may I notice from receiving this medication? Side effects that you should report to your care team as soon as possible: Allergic reactions--skin rash, itching, hives, swelling of the face, lips, tongue, or throat Burning, pain, tingling, or color changes in the legs or feet Heart attack--pain or tightness in the chest, shoulders, arms, or jaw, nausea, shortness of  breath, cold or clammy skin, feeling faint or lightheaded Heart rhythm changes--fast or irregular heartbeat, dizziness, feeling faint or lightheaded, chest pain,  trouble breathing Increase in blood pressure Raynaud's--cool, numb, or painful fingers or toes that may change color from pale, to blue, to red Seizures Serotonin syndrome--irritability, confusion, fast or irregular heartbeat, muscle stiffness, twitching muscles, sweating, high fever, seizure, chills, vomiting, diarrhea Stroke--sudden numbness or weakness of the face, arm, or leg, trouble speaking, confusion, trouble walking, loss of balance or coordination, dizziness, severe headache, change in vision Sudden or severe stomach pain, nausea, vomiting, fever, or bloody diarrhea Vision loss Side effects that usually do not require medical attention (report to your care team if they continue or are bothersome): Dizziness General discomfort or fatigue This list may not describe all possible side effects. Call your doctor for medical advice about side effects. You may report side effects to FDA at 1-800-FDA-1088. Where should I keep my medication? Keep out of the reach of children and pets. Store at room temperature between 2 and 30 degrees C (36 and 86 degrees F). Throw away any unused medication after the expiration date. NOTE: This sheet is a summary. It may not cover all possible information. If you have questions about this medicine, talk to your doctor, pharmacist, or health care provider.  2024 Elsevier/Gold Standard (2022-08-03 00:00:00)Topiramate  Tablets What is this medication? TOPIRAMATE  (toe PYRE a mate) prevents and controls seizures in people with epilepsy. It may also be used to prevent migraine headaches. It works by calming overactive nerves in your body. This medicine may be used for other purposes; ask your health care provider or pharmacist if you have questions. COMMON BRAND NAME(S): Topamax , Topiragen What should I tell my care team before I take this medication? They need to know if you have any of these conditions: Bleeding disorder Kidney disease Lung disease Suicidal  thoughts, plans, or attempt by you or a family member An unusual or allergic reaction to topiramate , other medications, foods, dyes, or preservatives Pregnant or trying to get pregnant Breast-feeding How should I use this medication? Take this medication by mouth with water. Take it as directed on the prescription label at the same time every day. Do not cut, crush or chew this medicine. Swallow the tablets whole. You can take it with or without food. If it upsets your stomach, take it with food. Keep taking it unless your care team tells you to stop. A special MedGuide will be given to you by the pharmacist with each prescription and refill. Be sure to read this information carefully each time. Talk to your care team about the use of this medication in children. While it may be prescribed for children as young as 2 years for selected conditions, precautions do apply. Overdosage: If you think you have taken too much of this medicine contact a poison control center or emergency room at once. NOTE: This medicine is only for you. Do not share this medicine with others. What if I miss a dose? If you miss a dose, take it as soon as you can unless it is within 6 hours of the next dose. If it is within 6 hours of the next dose, skip the missed dose. Take the next dose at the normal time. Do not take double or extra doses. What may interact with this medication? Acetazolamide Alcohol Antihistamines for allergy, cough, and cold Aspirin and aspirin-like medications Atropine Certain medications for anxiety or sleep Certain medications for bladder problems, such as oxybutynin,  tolterodine Certain medications for depression, such as amitriptyline, fluoxetine, sertraline Certain medications for Parkinson disease, such as benztropine, trihexyphenidyl Certain medications for seizures, such as carbamazepine, lamotrigine, phenobarbital, phenytoin, primidone, valproic acid, zonisamide Certain medications for  stomach problems, such as dicyclomine , hyoscyamine  Certain medications for travel sickness, such as scopolamine Certain medications that treat or prevent blood clots, such as warfarin, enoxaparin , dalteparin, apixaban, dabigatran, rivaroxaban Digoxin Diltiazem Estrogen and progestin hormones General anesthetics, such as halothane, isoflurane, methoxyflurane, propofol  Glyburide Hydrochlorothiazide Ipratropium Lithium Medications that relax muscles Metformin  NSAIDs, medications for pain and inflammation, such as ibuprofen  or naproxen Opioid medications for pain Phenothiazines, such as chlorpromazine, mesoridazine, prochlorperazine , thioridazine Pioglitazone This list may not describe all possible interactions. Give your health care provider a list of all the medicines, herbs, non-prescription drugs, or dietary supplements you use. Also tell them if you smoke, drink alcohol, or use illegal drugs. Some items may interact with your medicine. What should I watch for while using this medication? Visit your care team for regular checks on your progress. Tell your care team if your symptoms do not start to get better or if they get worse. Do not suddenly stop taking this medication. You may develop a severe reaction. Your care team will tell you how much medication to take. If your care team wants you to stop the medication, the dose may be slowly lowered over time to avoid any side effects. Wear a medical ID bracelet or chain. Carry a card that describes your condition. List the medications and doses you take on the card. This medication may affect your coordination, reaction time, or judgment. Do not drive or operate machinery until you know how this medication affects you. Sit up or stand slowly to reduce the risk of dizzy or fainting spells. Drinking alcohol with this medication can increase the risk of these side effects. This medication may cause serious skin reactions. They can happen weeks to  months after starting the medication. Contact your care team right away if you notice fevers or flu-like symptoms with a rash. The rash may be red or purple and then turn into blisters or peeling of the skin. You may also notice a red rash with swelling of the face, lips, or lymph nodes in your neck or under your arms. This medication may cause thoughts of suicide or depression. This includes sudden changes in mood, behaviors, or thoughts. These changes can happen at any time but are more common in the beginning of treatment or after a change in dose. Call your care team right away if you experience these thoughts or worsening depression. This medication may slow your child's growth if it is taken for a long time at high doses. Your child's care team will monitor your child's growth. Using this medication for a long time may weaken your bones. The risk of bone fractures may be increased. Talk to your care team about your bone health. Discuss this medication with your care team if you may be pregnant. Serious birth defects can occur if you take this medication during pregnancy. There are benefits and risks to taking medications during pregnancy. Your care team can help you find the option that works for you. Contraception is recommended while taking this medication. Estrogen and progestin hormones may not work as well while you are taking this medication. Your care team can help you find the option that works for you. Talk to your care team before breastfeeding. Changes to your treatment plan may be needed. What side  effects may I notice from receiving this medication? Side effects that you should report to your care team as soon as possible: Allergic reactions--skin rash, itching, hives, swelling of the face, lips, tongue, or throat High acid level--trouble breathing, unusual weakness or fatigue, confusion, headache, fast or irregular heartbeat, nausea, vomiting High ammonia level--unusual weakness or  fatigue, confusion, loss of appetite, nausea, vomiting, seizures Fever that does not go away, decrease in sweat Kidney stones--blood in the urine, pain or trouble passing urine, pain in the lower back or sides Redness, blistering, peeling or loosening of the skin, including inside the mouth Sudden eye pain or change in vision such as blurry vision, seeing halos around lights, vision loss Thoughts of suicide or self-harm, worsening mood, feelings of depression Side effects that usually do not require medical attention (report to your care team if they continue or are bothersome): Burning or tingling sensation in hands or feet Difficulty with paying attention, memory, or speech Dizziness Drowsiness Fatigue Loss of appetite with weight loss Slow or sluggish movements of the body This list may not describe all possible side effects. Call your doctor for medical advice about side effects. You may report side effects to FDA at 1-800-FDA-1088. Where should I keep my medication? Keep out of the reach of children and pets. Store between 15 and 30 degrees C (59 and 86 degrees F). Protect from moisture. Keep the container tightly closed. Get rid of any unused medication after the expiration date. To get rid of medications that are no longer needed or have expired: Take the medication to a medication take-back program. Check with your pharmacy or law enforcement to find a location. If you cannot return the medication, check the label or package insert to see if the medication should be thrown out in the garbage or flushed down the toilet. If you are not sure, ask your care team. If it is safe to put it in the trash, empty the medication out of the container. Mix the medication with cat litter, dirt, coffee grounds, or other unwanted substance. Seal the mixture in a bag or container. Put it in the trash. NOTE: This sheet is a summary. It may not cover all possible information. If you have questions about  this medicine, talk to your doctor, pharmacist, or health care provider.  2024 Elsevier/Gold Standard (2022-02-18 00:00:00)

## 2024-05-18 ENCOUNTER — Ambulatory Visit: Payer: Self-pay | Admitting: Neurology

## 2024-05-18 DIAGNOSIS — G43709 Chronic migraine without aura, not intractable, without status migrainosus: Secondary | ICD-10-CM

## 2024-05-18 LAB — TSH RFX ON ABNORMAL TO FREE T4: TSH: 1.48 u[IU]/mL (ref 0.450–4.500)

## 2024-05-21 ENCOUNTER — Other Ambulatory Visit: Payer: Self-pay | Admitting: *Deleted

## 2024-05-21 MED ORDER — SUMATRIPTAN SUCCINATE 100 MG PO TABS: ORAL_TABLET | ORAL | 12 refills | Status: AC

## 2024-05-24 ENCOUNTER — Telehealth: Payer: Self-pay | Admitting: Neurology

## 2024-05-24 NOTE — Telephone Encounter (Signed)
 [facility]: [id number] exp. 05/23/24-07/22/24 sent to [facility] [phone]

## 2024-10-20 ENCOUNTER — Encounter (HOSPITAL_BASED_OUTPATIENT_CLINIC_OR_DEPARTMENT_OTHER): Payer: Self-pay

## 2024-10-20 ENCOUNTER — Emergency Department (HOSPITAL_BASED_OUTPATIENT_CLINIC_OR_DEPARTMENT_OTHER)
Admission: EM | Admit: 2024-10-20 | Discharge: 2024-10-20 | Disposition: A | Discharge status: Home / Self Care | Attending: Emergency Medicine | Admitting: Emergency Medicine

## 2024-10-20 ENCOUNTER — Other Ambulatory Visit: Payer: Self-pay

## 2024-10-20 ENCOUNTER — Emergency Department (HOSPITAL_BASED_OUTPATIENT_CLINIC_OR_DEPARTMENT_OTHER)

## 2024-10-20 DIAGNOSIS — Z79899 Other long term (current) drug therapy: Secondary | ICD-10-CM | POA: Diagnosis not present

## 2024-10-20 DIAGNOSIS — Z3A08 8 weeks gestation of pregnancy: Secondary | ICD-10-CM | POA: Insufficient documentation

## 2024-10-20 DIAGNOSIS — D649 Anemia, unspecified: Secondary | ICD-10-CM | POA: Insufficient documentation

## 2024-10-20 DIAGNOSIS — I10 Essential (primary) hypertension: Secondary | ICD-10-CM | POA: Insufficient documentation

## 2024-10-20 DIAGNOSIS — O039 Complete or unspecified spontaneous abortion without complication: Secondary | ICD-10-CM | POA: Diagnosis not present

## 2024-10-20 DIAGNOSIS — O209 Hemorrhage in early pregnancy, unspecified: Secondary | ICD-10-CM | POA: Diagnosis present

## 2024-10-20 LAB — COMPREHENSIVE METABOLIC PANEL WITH GFR
ALT: 9 U/L (ref 0–44)
AST: 13 U/L — ABNORMAL LOW (ref 15–41)
Albumin: 3.7 g/dL (ref 3.5–5.0)
Alkaline Phosphatase: 50 U/L (ref 38–126)
Anion gap: 13 (ref 5–15)
BUN: 17 mg/dL (ref 6–20)
CO2: 19 mmol/L — ABNORMAL LOW (ref 22–32)
Calcium: 9 mg/dL (ref 8.9–10.3)
Chloride: 103 mmol/L (ref 98–111)
Creatinine, Ser: 0.88 mg/dL (ref 0.44–1.00)
GFR, Estimated: >60 mL/min
Glucose, Bld: 136 mg/dL — ABNORMAL HIGH (ref 70–99)
Potassium: 4.3 mmol/L (ref 3.5–5.1)
Sodium: 135 mmol/L (ref 135–145)
Total Bilirubin: 0.5 mg/dL (ref 0.0–1.2)
Total Protein: 6.5 g/dL (ref 6.5–8.1)

## 2024-10-20 LAB — CBC WITH DIFFERENTIAL/PLATELET
Abs Immature Granulocytes: 0.03 K/uL (ref 0.00–0.07)
Basophils Absolute: 0 K/uL (ref 0.0–0.1)
Basophils Relative: 0 %
Eosinophils Absolute: 0.2 K/uL (ref 0.0–0.5)
Eosinophils Relative: 2 %
HCT: 25.3 % — ABNORMAL LOW (ref 36.0–46.0)
Hemoglobin: 8.9 g/dL — ABNORMAL LOW (ref 12.0–15.0)
Immature Granulocytes: 0 %
Lymphocytes Relative: 25 %
Lymphs Abs: 2.4 K/uL (ref 0.7–4.0)
MCH: 30.8 pg (ref 26.0–34.0)
MCHC: 35.2 g/dL (ref 30.0–36.0)
MCV: 87.5 fL (ref 80.0–100.0)
Monocytes Absolute: 0.7 K/uL (ref 0.1–1.0)
Monocytes Relative: 7 %
Neutro Abs: 6.2 K/uL (ref 1.7–7.7)
Neutrophils Relative %: 66 %
Platelets: 219 K/uL (ref 150–400)
RBC: 2.89 MIL/uL — ABNORMAL LOW (ref 3.87–5.11)
RDW: 12.1 % (ref 11.5–15.5)
WBC: 9.6 K/uL (ref 4.0–10.5)
nRBC: 0 % (ref 0.0–0.2)

## 2024-10-20 LAB — URINALYSIS, MICROSCOPIC (REFLEX)

## 2024-10-20 LAB — URINALYSIS, ROUTINE W REFLEX MICROSCOPIC
Bilirubin Urine: NEGATIVE
Glucose, UA: NEGATIVE mg/dL
Ketones, ur: NEGATIVE mg/dL
Leukocytes,Ua: NEGATIVE
Nitrite: NEGATIVE
Protein, ur: >=300 mg/dL — AB
Specific Gravity, Urine: 1.025 (ref 1.005–1.030)
pH: 5.5 (ref 5.0–8.0)

## 2024-10-20 LAB — ABO/RH: ABO/RH(D): O POS

## 2024-10-20 LAB — HCG, QUANTITATIVE, PREGNANCY: hCG, Beta Chain, Quant, S: 3988 m[IU]/mL — ABNORMAL HIGH

## 2024-10-20 MED ORDER — FERROUS SULFATE 325 (65 FE) MG PO TABS: 325.0000 mg | ORAL_TABLET | Freq: Every day | ORAL | 0 refills | Status: AC

## 2024-10-20 NOTE — ED Provider Notes (Signed)
 " [facility] Provider Note   CSN: [id number] Arrival date & time: 10/20/24  9078     Patient presents with: Vaginal Bleeding   [name] is a 34 y.o. female.   HPI 34 year old female presents with vaginal bleeding in early pregnancy.  Patient states that her last menstrual cycle was at the end of October.  She has irregular cycles but feels like she has missed 1.  Due to this she has taken 2 pregnancy test at home that are both positive.  No abdominal pain or urinary symptoms or vaginal discharge.  However this morning after urinating she wiped and noticed some blood.  It was milder on the next time and then she has not noticed blood since.  This is her first pregnancy and so she was concerned and wanted to get checked out.  She has a history of hypertension and was on lisinopril though her doctor changed this due to the positive pregnancy test.  She has not yet taken her meds this morning.  She believes her blood type is O+.  Prior to Admission medications  Medication Sig Start Date End Date Taking? Authorizing Provider  ferrous sulfate  325 (65 FE) MG tablet Take 1 tablet (325 mg total) by mouth daily. 10/20/24  Yes Freddi Hamilton, MD  atorvastatin (LIPITOR) 20 MG tablet Take 20 mg by mouth at bedtime. 07/06/23   [provider]  glimepiride (AMARYL) 2 MG tablet Take 2 mg by mouth every morning.    [provider]  HYDROcodone -acetaminophen  (NORCO) 7.5-325 MG tablet Take 1 tablet by mouth every 6 (six) hours as needed for up to 20 doses for Moderate pain (4-6). 05/21/21     lisinopril (ZESTRIL) 40 MG tablet Take 40 mg by mouth daily. 03/15/24 09/11/24  [provider]  metFORMIN  (GLUCOPHAGE ) 1000 MG tablet Take 1,000 mg by mouth 2 (two) times daily.    [provider]  metoCLOPramide  (REGLAN ) 10 MG tablet Take 1 tablet (10 mg total) by mouth every 6 (six) hours as needed for nausea. 04/29/23   Francesca Elsie CROME, MD  ondansetron  (ZOFRAN -ODT) 8 MG disintegrating tablet Take 1 tablet (8 mg total) by mouth every 8 (eight) hours as needed. 05/17/24   Ines Onetha NOVAK, MD  SUMAtriptan  (IMITREX ) 100 MG tablet Take 1 tablet onset of migraine, may repeat in 2 hours if needed. (Max 2 tabs/ 24 hrs) 05/21/24   Ines Onetha NOVAK, MD  topiramate  (TOPAMAX ) 50 MG tablet Start with 50mg (1 tab) at bedtime and then in 2 weeks, if no side effects, increase to 100mg (2 tabs) 05/17/24   Ines Onetha NOVAK, MD    Allergies: Raspberry and Other    Review of Systems  Gastrointestinal:  Negative for abdominal pain.  Genitourinary:  Positive for vaginal bleeding. Negative for dysuria and vaginal discharge.    Updated Vital Signs BP (!) 154/84 (BP Location: Right Arm)   Pulse 87   Temp 98.5 F (36.9 C) (Oral)   Resp 20   Ht 5' 8 (1.727 m)   Wt 111.1 kg   LMP 07/31/2024   SpO2 98%   BMI 37.25 kg/m   Physical Exam Vitals and nursing note reviewed.  Constitutional:      Appearance: She is well-developed. She is obese.  HENT:     Head: Normocephalic and atraumatic.  Pulmonary:     Effort: Pulmonary effort is normal.  Abdominal:     Palpations: Abdomen is soft.  Tenderness: There is no abdominal tenderness.  Skin:    General: Skin is warm and dry.  Neurological:     Mental Status: She is alert.     (all labs ordered are listed, but only abnormal results are displayed) Labs Reviewed  URINALYSIS, ROUTINE W REFLEX MICROSCOPIC - Abnormal; Notable for the following components:      Result Value   Hgb urine dipstick LARGE (*)    Protein, ur >=300 (*)    All other components within normal limits  COMPREHENSIVE METABOLIC PANEL WITH GFR - Abnormal; Notable for the following components:   CO2 19 (*)    Glucose, Bld 136 (*)    AST 13 (*)    All other components within normal limits  CBC WITH DIFFERENTIAL/PLATELET - Abnormal; Notable for the following components:   RBC 2.89 (*)    Hemoglobin 8.9 (*)     HCT 25.3 (*)    All other components within normal limits  HCG, QUANTITATIVE, PREGNANCY - Abnormal; Notable for the following components:   hCG, Beta Chain, Quant, S 3,988 (*)    All other components within normal limits  URINALYSIS, MICROSCOPIC (REFLEX) - Abnormal; Notable for the following components:   Bacteria, UA RARE (*)    All other components within normal limits  ABO/RH    EKG: None  Radiology: US  OB LESS THAN 14 WEEKS WITH OB TRANSVAGINAL Result Date: 10/20/2024 CLINICAL DATA:  Spotting during early pregnancy. EXAM: OBSTETRIC <14 WK US  AND TRANSVAGINAL OB US  TECHNIQUE: Both transabdominal and transvaginal ultrasound examinations were performed for complete evaluation of the gestation as well as the maternal uterus, adnexal regions, and pelvic cul-de-sac. Transvaginal technique was performed to assess early pregnancy. COMPARISON:  None Available. FINDINGS: Intrauterine gestational sac: Single Yolk sac:  Not Visualized. Embryo:  Visualized. Cardiac Activity: Not Visualized. Heart Rate: N/A  bpm CRL:  19.5 mm   8 w   4 d                  US  EDC: N/A Subchorionic hemorrhage: Small to moderate sized (3.2 cm x 0.6 cm x 2.8 cm) Maternal uterus/adnexae: The right ovary is visualized and is normal in appearance. A corpus luteum cyst is seen within the left ovary. No pelvic free fluid is identified. IMPRESSION: Findings meet definitive criteria for failed pregnancy. This follows SRU consensus guidelines: Diagnostic Criteria for Nonviable Pregnancy Early in the First Trimester. LOISE [place] [facility] [phone]. Electronically Signed   By: Suzen Dials M.D.   On: 10/20/2024 12:24     Procedures   Medications Ordered in the ED - No data to display                                  Medical Decision Making Amount and/or Complexity of Data Reviewed Labs: ordered.    Details: hCG 3988.  O+ Radiology: ordered and independent interpretation performed.    Details: IUP without  heartbeat  Risk OTC drugs.   Patient has hypertension but is otherwise hemodynamically stable.  Benign abdominal exam.  Unfortunately her workup shows a failed pregnancy with no heartbeat on ultrasound.  I have relayed this information to her (and with her permission to her family).  Questions answered.  Will have her follow-up with OB/GYN for expectant management.  She has not had a significant amount of bleeding but her hemoglobin is low at 8.9.  This is lower than her typical  baseline.  Given she is otherwise stable we will start her on iron and have her follow-up with her PCP and/her OB/GYN for this.  Unclear chronicity for this based on the amount of bleeding she is describing as not likely the cause.  Will discharge home with return precautions.    Final diagnoses:  Miscarriage  Anemia, unspecified type    ED Discharge Orders          Ordered    ferrous sulfate  325 (65 FE) MG tablet  Daily        10/20/24 1255               Freddi Hamilton, MD 10/20/24 1314  "

## 2024-10-20 NOTE — ED Triage Notes (Signed)
 Pt with positive pregnancy test on 1/1. Pt peed this morning and when she wiped saw some blood and is concerned - first pregnancy. Denies abdominal pain, minimal smear of blood.

## 2024-10-20 NOTE — Discharge Instructions (Addendum)
 Unfortunately, as we discussed, you are having a miscarriage.  Call your OB/GYN today for further instructions.  Return to the ER for any new or worsening symptoms, especially abdominal pain.  Your hemoglobin is low today.  You will need to get this rechecked but should start on iron.  This prescription has been sent to [facility].

## 2024-10-25 ENCOUNTER — Ambulatory Visit: Admitting: Family Medicine

## 2024-11-28 ENCOUNTER — Ambulatory Visit: Admitting: Adult Health

## 2025-01-09 ENCOUNTER — Ambulatory Visit: Admitting: Adult Health
